# Patient Record
Sex: Male | Born: 1965 | Race: Black or African American | Hispanic: No | Smoking: Current every day smoker
Health system: Southern US, Community
[De-identification: ages and names within clinical notes are randomized; demographics above are authoritative.]

## PROBLEM LIST (undated history)

## (undated) DIAGNOSIS — F17200 Nicotine dependence, unspecified, uncomplicated: Secondary | ICD-10-CM

## (undated) DIAGNOSIS — F1020 Alcohol dependence, uncomplicated: Secondary | ICD-10-CM

## (undated) DIAGNOSIS — N400 Enlarged prostate without lower urinary tract symptoms: Secondary | ICD-10-CM

## (undated) DIAGNOSIS — I10 Essential (primary) hypertension: Secondary | ICD-10-CM

## (undated) DIAGNOSIS — I5032 Chronic diastolic (congestive) heart failure: Secondary | ICD-10-CM

## (undated) DIAGNOSIS — I82409 Acute embolism and thrombosis of unspecified deep veins of unspecified lower extremity: Secondary | ICD-10-CM

## (undated) HISTORY — PX: NO PAST SURGERIES: SHX2092

---

## 2004-07-05 ENCOUNTER — Emergency Department (HOSPITAL_COMMUNITY): Admission: EM | Admit: 2004-07-05 | Discharge: 2004-07-05 | Payer: Self-pay | Admitting: Emergency Medicine

## 2008-12-02 ENCOUNTER — Emergency Department (HOSPITAL_COMMUNITY): Admission: EM | Admit: 2008-12-02 | Discharge: 2008-12-02 | Payer: Self-pay | Admitting: Emergency Medicine

## 2009-06-27 ENCOUNTER — Emergency Department (HOSPITAL_COMMUNITY): Admission: EM | Admit: 2009-06-27 | Discharge: 2009-06-27 | Payer: Self-pay | Admitting: Family Medicine

## 2014-03-18 ENCOUNTER — Encounter: Payer: Self-pay | Admitting: Internal Medicine

## 2014-04-08 ENCOUNTER — Encounter: Payer: Self-pay | Admitting: Internal Medicine

## 2014-04-13 ENCOUNTER — Encounter: Payer: Self-pay | Admitting: Internal Medicine

## 2014-04-13 ENCOUNTER — Ambulatory Visit (INDEPENDENT_AMBULATORY_CARE_PROVIDER_SITE_OTHER): Payer: Self-pay | Admitting: Internal Medicine

## 2014-04-13 VITALS — BP 138/80 | HR 82 | Ht 71.0 in | Wt 176.0 lb

## 2014-04-13 DIAGNOSIS — R600 Localized edema: Secondary | ICD-10-CM

## 2014-04-13 DIAGNOSIS — R609 Edema, unspecified: Secondary | ICD-10-CM

## 2014-04-13 DIAGNOSIS — R634 Abnormal weight loss: Secondary | ICD-10-CM

## 2014-04-13 DIAGNOSIS — R6881 Early satiety: Secondary | ICD-10-CM

## 2014-04-13 DIAGNOSIS — F17200 Nicotine dependence, unspecified, uncomplicated: Secondary | ICD-10-CM

## 2014-04-13 MED ORDER — PEG-KCL-NACL-NASULF-NA ASC-C 100 G PO SOLR: 1.0000 | Freq: Once | ORAL | Status: DC

## 2014-04-13 NOTE — Patient Instructions (Signed)
You have been scheduled for an abdominal ultrasound [facility] of hospital) on 04/20/2014 at 8am Please arrive 15 minutes prior to your appointment for registration. Make certain not to have anything to eat or drink 6 hours prior to your appointment. Should you need to reschedule your appointment, please contact radiology at [phone]. This test typically takes about 30 minutes to perform.  Go to the basement for labs today  You have been scheduled for an endoscopy and colonoscopy. Please follow the written instructions given to you at your visit today. Please pick up your prep at the pharmacy within the next 1-3 days. If you use inhalers (even only as needed), please bring them with you on the day of your procedure. Your physician has requested that you go to [email or web address] and enter the access code given to you at your visit today. This web site gives a general overview about your procedure. However, you should still follow specific instructions given to you by our office regarding your preparation for the procedure.

## 2014-04-13 NOTE — Progress Notes (Signed)
Patient ID: Claire Shown, male   DOB: 1966/05/09, 48 y.o.   MRN: [id number] HPI: Mr Roldan is a 48 year old male with little past medical history who is seen to evaluate weight loss.  He is here alone today but states that his aunt insisted that he schedule an appointment with a doctor. He does not have primary care and last saw Dr. probably 5 years ago. He denies specific complaint but on questioning reports weight loss, though he has a difficult time quantifying, because he does not weigh himself at home. He endorses loss of appetite and early satiety. He reports he previously ate 3 meals a day but is now eating 2 meals a day. He denies nausea or vomiting. Denies heartburn. Denies dysphagia or odynophagia. Denies abdominal pain. Denies change in bowel movement including diarrhea or constipation. Denies black or tarry stools and no rectal bleeding. He does smoke cigarettes about 10 cigarettes a day. He averages one beer per day but admits to previous heavy alcohol use. Denies illicit drug use. No prior surgeries. He reports significant stress in his life specifically because he is raising 4 sons. He lost his wife 3 years ago. He has 2 adult sons who finished high school but are currently unemployed. He denies depression or anxiety today. He does endorse her last 2 weeks noticing lower extremity edema but denies increasing abdominal girth. He denies a history of jaundice. Denies family history of colorectal cancer, GI tract malignancy, IBD or liver disease   History reviewed. No pertinent past medical history.  Past Surgical History  Procedure Laterality Date  . No past surgeries      No outpatient prescriptions prior to visit.   No facility-administered medications prior to visit.    No Known Allergies  Family History  Problem Relation Age of Onset  . Colon cancer Neg Hx   . Stomach cancer Neg Hx     History  Substance Use Topics  . Smoking status: Current Every Day Smoker  . Smokeless  tobacco: Never Used  . Alcohol Use: Yes     Comment: one daily     ROS: As per history of present illness, otherwise negative  BP 138/80  Pulse 82  Ht 5\' 11"  (1.803 m)  Wt 176 lb (79.833 kg)  BMI 24.56 kg/m2 Constitutional: Well-developed and well-nourished. No distress. HEENT: Normocephalic and atraumatic. Oropharynx is clear and moist. No oropharyngeal exudate. Conjunctivae are normal.  No scleral icterus. Neck: Neck supple. Trachea midline. Cardiovascular: Normal rate, regular rhythm and intact distal pulses. No M/R/G Pulmonary/chest: Effort normal and breath sounds normal. No wheezing, rales or rhonchi. Abdominal: Soft, nontender, nondistended. Bowel sounds active throughout.  liver edge not palpable  Extremities: no clubbing, cyanosis, with 2+ lower extremity edema  Neurological: Alert and oriented to person place and time. Skin: Skin is warm and dry. No rashes noted. Psychiatric: Normal mood and affect. Behavior is normal.  ASSESSMENT/PLAN: 49 year old male with little past medical history who is seen to evaluate weight loss, also with complaints of early satiety and lower extremity edema  1.  Weight loss//early satiety -- he has had little medical care recently and I have no way to know how much weight he has truly lost. I am concerned because he admits to previous heavy alcohol use and I have ordered an abdominal ultrasound to evaluate his liver. I will check labs today to include CBC, CMP, INR, hepatitis C antibody and HIV. I recommended upper endoscopy to rule out upper GI inflammation and also  malignancy given his weight loss and early satiety.  I've also recommended colonoscopy for screening and given his weight loss. We discussed both tests including the risks and benefits and he is agreeable to proceed  2. Lower extremity edema -- await abdominal ultrasound which help evaluate the liver and also rule out splenomegaly and abdominal ascites. There is certainly not tense  ascites. I recommended primary care consult, but he declines and wants to finish with my workup first. I stated that I very much recommend and would be more comfortable for him to see primary care provider regarding his lower extremity edema.  Further recommendations after labs, imaging and endoscopies

## 2014-04-14 ENCOUNTER — Telehealth: Payer: Self-pay | Admitting: *Deleted

## 2014-04-14 NOTE — Telephone Encounter (Signed)
Dr Rhea BeltonPyrtle, No Working number for this patient  He was informed to have his labs drawn before he left

## 2014-04-14 NOTE — Telephone Encounter (Signed)
Message copied by Marlowe KaysSTALLINGS, Glennon Kopko M on Tue Apr 14, 2014  4:12 PM ------      Message from: Beverley FiedlerPYRTLE, JAY M      Created: Tue Apr 14, 2014  3:36 PM       Pt left without having labs done.      Please call him and ask him to return to the lab for tests as ordered.      Thanks      JMP       ------

## 2014-04-15 NOTE — Telephone Encounter (Signed)
DR PYRTLE AWARE NO WORKING NUMBERS FOR THIS PT. WILL GET LABS IF HE SHOWS FOR HIS PROCEDURES ON THAT DAY

## 2014-04-20 ENCOUNTER — Ambulatory Visit (HOSPITAL_COMMUNITY): Payer: Self-pay

## 2014-05-26 ENCOUNTER — Telehealth: Payer: Self-pay | Admitting: Internal Medicine

## 2014-05-26 NOTE — Telephone Encounter (Signed)
No charge, but pt is strongly encouraged to proceed with work-up, come for labs and establish with PCP

## 2014-05-27 ENCOUNTER — Encounter: Payer: Self-pay | Admitting: Internal Medicine

## 2018-06-12 ENCOUNTER — Other Ambulatory Visit: Payer: Self-pay | Admitting: Internal Medicine

## 2018-06-12 DIAGNOSIS — I82402 Acute embolism and thrombosis of unspecified deep veins of left lower extremity: Secondary | ICD-10-CM

## 2018-06-18 ENCOUNTER — Ambulatory Visit: Payer: No Typology Code available for payment source

## 2018-06-18 DIAGNOSIS — I82402 Acute embolism and thrombosis of unspecified deep veins of left lower extremity: Secondary | ICD-10-CM

## 2019-12-29 IMAGING — US US EXTREM LOW VENOUS*L*
1 series · 13 of 24 positions shown · non-contrast
Comparison: None.

CLINICAL DATA: 52-year-old male with left lower extremity pain and
swelling since DVT diagnosed in Saturday April, 2017



[Series 1: us extrem low venous*left* · 0.08mm/px · 13 of 35 slices shown]
[im 1/35]
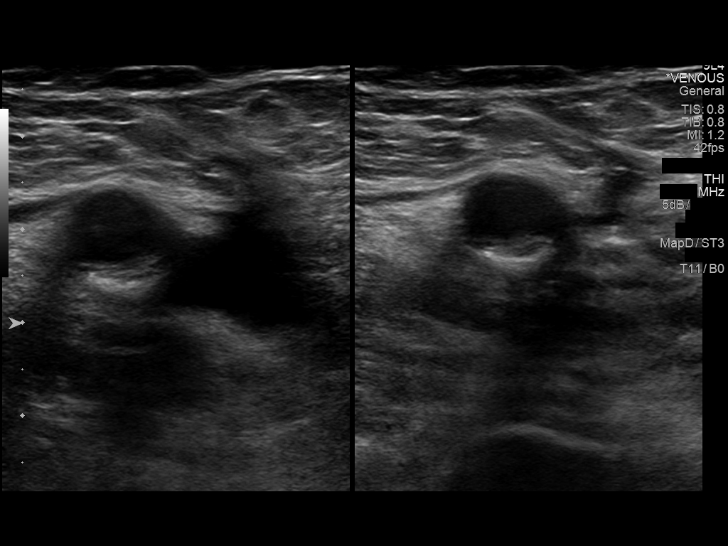
[im 3/35]
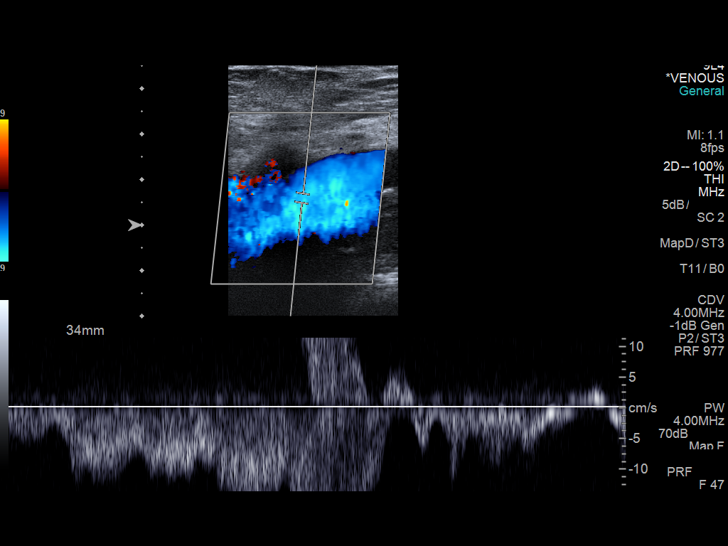
[im 6/35]
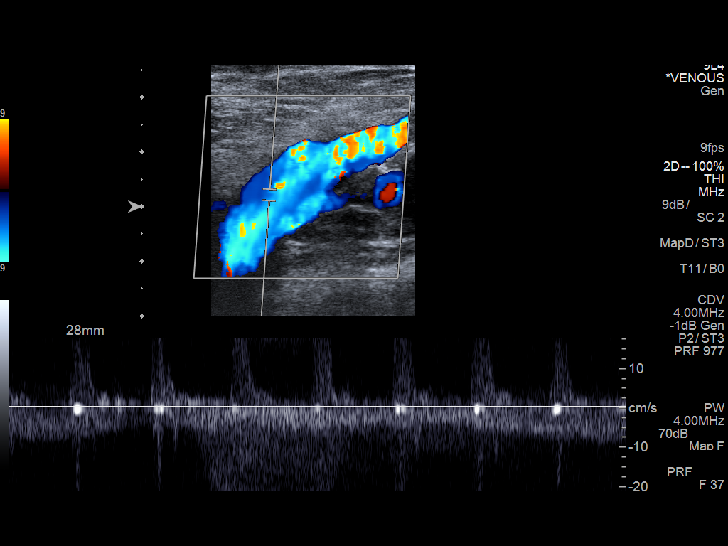
[im 9/35]
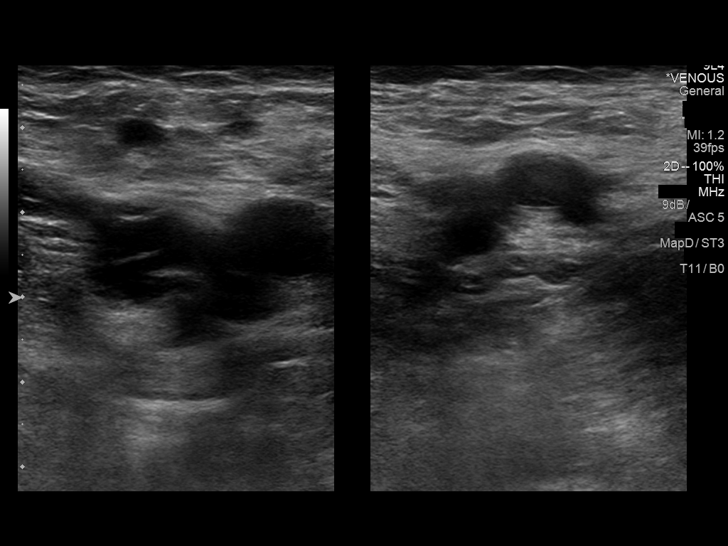
[im 12/35]
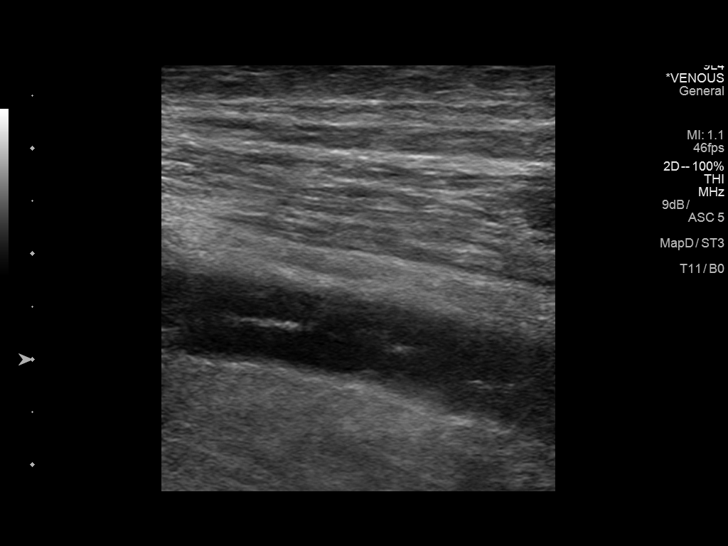
[im 15/35]
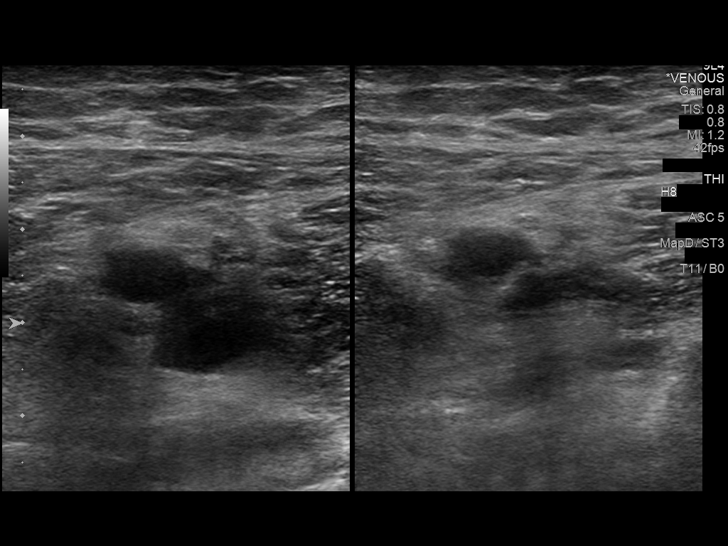
[im 18/35]
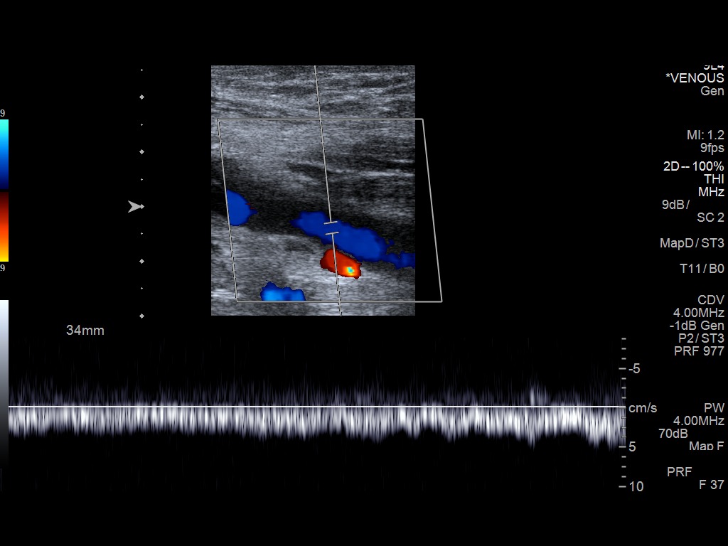
[im 20/35]
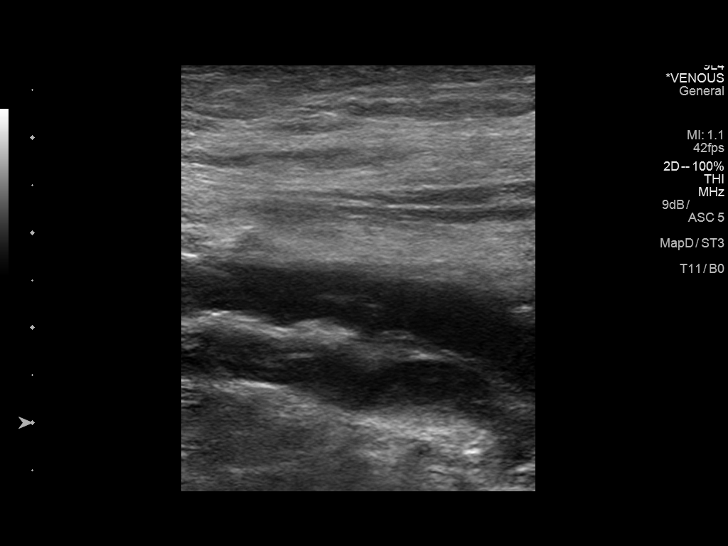
[im 23/35]
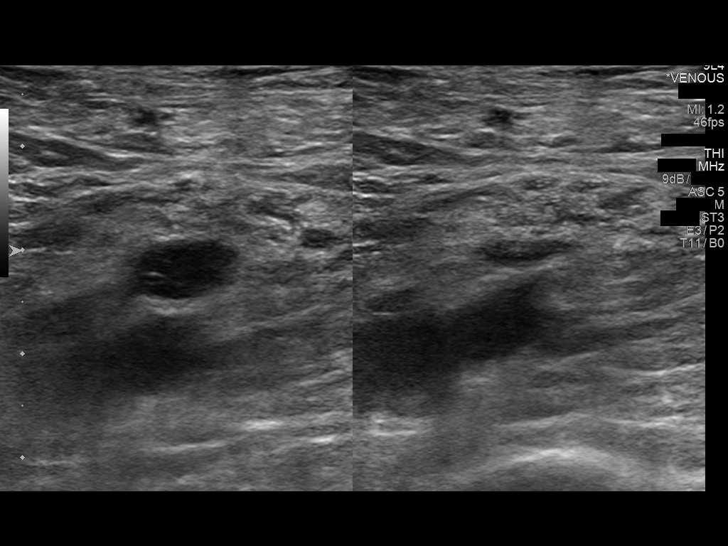
[im 26/35]
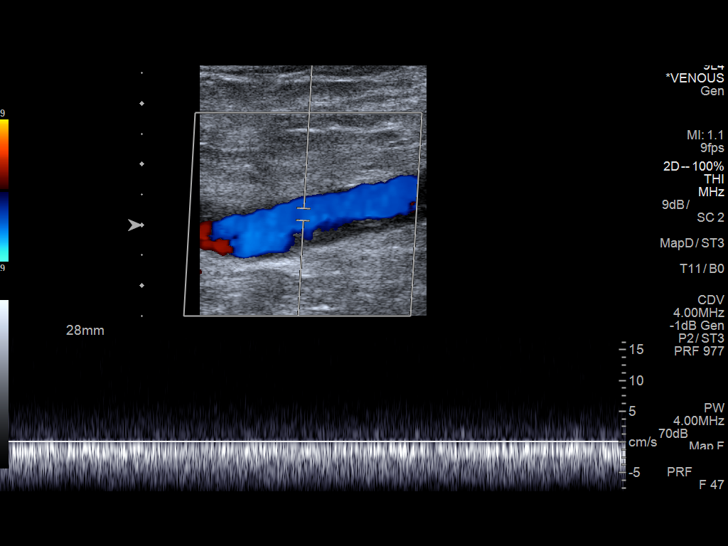
[im 29/35]
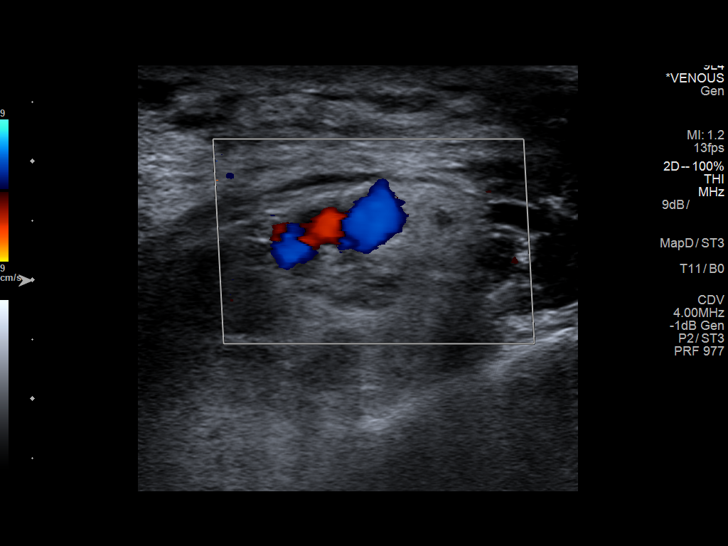
[im 32/35]
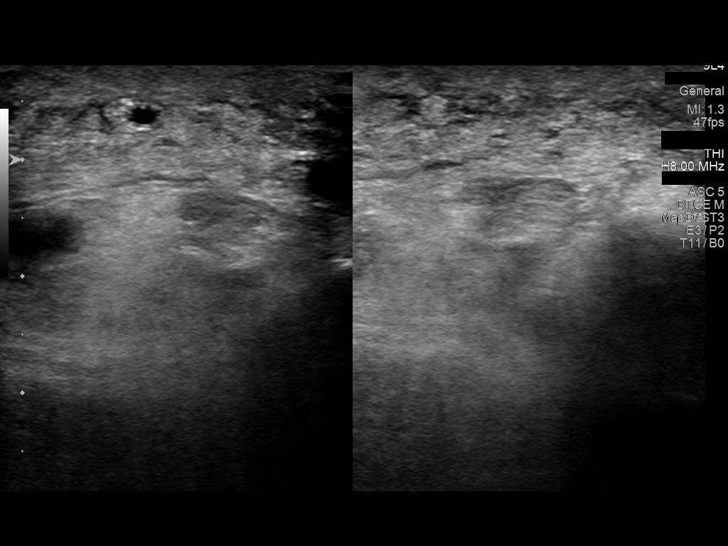
[im 35/35]
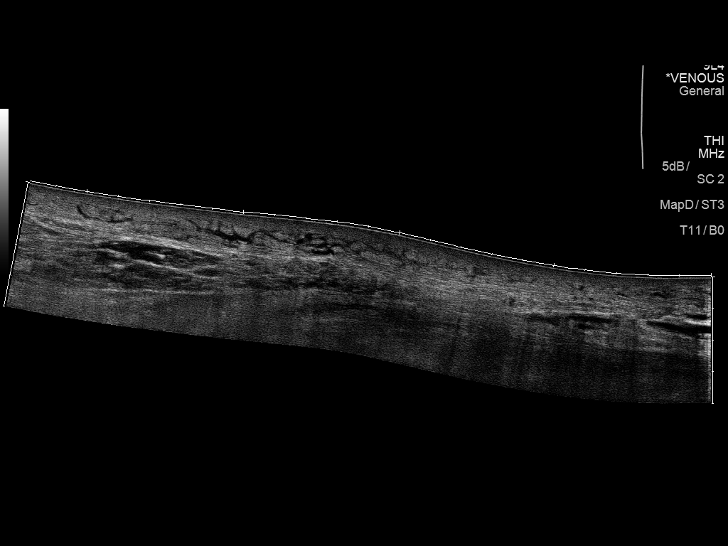

[13 of 24 positions shown; findings below may reference images not displayed]

FINDINGS: Contralateral Common Femoral Vein: Respiratory phasicity is normal
and symmetric with the symptomatic side. No evidence of thrombus.
Normal compressibility.

Common Femoral Vein: No evidence of thrombus. Normal
compressibility, respiratory phasicity and response to augmentation.

Saphenofemoral Junction: No evidence of thrombus. Normal
compressibility and flow on color Doppler imaging.

Profunda Femoral Vein: No evidence of thrombus. Normal
compressibility and flow on color Doppler imaging.

Femoral Vein: The femoral vein is abnormal beginning in the upper
thigh. Linear echogenic material is present within the vein which is
only partially compressible. However, there is relatively robust
flow on color Doppler imaging. The imaging appearance is most
consistent with residual but largely recanalized chronic DVT.

Popliteal Vein: Chronic DVT extends into the popliteal vein. There
is eccentric wall thickening. Color flow persists on color Doppler
imaging. The vein remains mostly but incompletely compressible. No
evidence of acute DVT.

Calf Veins: No evidence of thrombus. Normal compressibility and flow
on color Doppler imaging.

Superficial Great Saphenous Vein: No evidence of thrombus. Normal
compressibility.

Venous Reflux:  None.

Other Findings: Edema present within the superficial subcutaneous
fat of the left lower extremity from the mid calf to the ankle.
IMPRESSION: 1. No evidence of acute deep venous thrombosis.
2. Residual chronic but largely recanalized DVT in the femoral vein
throughout the thigh and popliteal vein at the knee.

## 2023-01-25 ENCOUNTER — Other Ambulatory Visit (HOSPITAL_COMMUNITY): Payer: No Typology Code available for payment source

## 2023-01-25 ENCOUNTER — Inpatient Hospital Stay (HOSPITAL_COMMUNITY)
Admission: EM | Admit: 2023-01-25 | Discharge: 2023-01-27 | DRG: 291 | Disposition: A | Discharge status: Home / Self Care | Payer: No Typology Code available for payment source | Attending: Internal Medicine | Admitting: Internal Medicine

## 2023-01-25 ENCOUNTER — Other Ambulatory Visit: Payer: Self-pay

## 2023-01-25 ENCOUNTER — Emergency Department (HOSPITAL_COMMUNITY): Payer: No Typology Code available for payment source

## 2023-01-25 ENCOUNTER — Encounter (HOSPITAL_COMMUNITY): Payer: Self-pay

## 2023-01-25 DIAGNOSIS — I5033 Acute on chronic diastolic (congestive) heart failure: Secondary | ICD-10-CM | POA: Diagnosis present

## 2023-01-25 DIAGNOSIS — E119 Type 2 diabetes mellitus without complications: Secondary | ICD-10-CM | POA: Diagnosis present

## 2023-01-25 DIAGNOSIS — I110 Hypertensive heart disease with heart failure: Secondary | ICD-10-CM | POA: Diagnosis present

## 2023-01-25 DIAGNOSIS — N400 Enlarged prostate without lower urinary tract symptoms: Secondary | ICD-10-CM | POA: Diagnosis present

## 2023-01-25 DIAGNOSIS — Z7982 Long term (current) use of aspirin: Secondary | ICD-10-CM

## 2023-01-25 DIAGNOSIS — Y92410 Unspecified street and highway as the place of occurrence of the external cause: Secondary | ICD-10-CM | POA: Diagnosis not present

## 2023-01-25 DIAGNOSIS — Z86718 Personal history of other venous thrombosis and embolism: Secondary | ICD-10-CM | POA: Diagnosis not present

## 2023-01-25 DIAGNOSIS — Z79899 Other long term (current) drug therapy: Secondary | ICD-10-CM

## 2023-01-25 DIAGNOSIS — A419 Sepsis, unspecified organism: Secondary | ICD-10-CM | POA: Diagnosis present

## 2023-01-25 DIAGNOSIS — E871 Hypo-osmolality and hyponatremia: Secondary | ICD-10-CM | POA: Diagnosis present

## 2023-01-25 DIAGNOSIS — Y904 Blood alcohol level of 80-99 mg/100 ml: Secondary | ICD-10-CM | POA: Diagnosis present

## 2023-01-25 DIAGNOSIS — I10 Essential (primary) hypertension: Secondary | ICD-10-CM | POA: Diagnosis not present

## 2023-01-25 DIAGNOSIS — F1020 Alcohol dependence, uncomplicated: Secondary | ICD-10-CM | POA: Diagnosis present

## 2023-01-25 DIAGNOSIS — R531 Weakness: Secondary | ICD-10-CM

## 2023-01-25 DIAGNOSIS — F17200 Nicotine dependence, unspecified, uncomplicated: Secondary | ICD-10-CM | POA: Diagnosis present

## 2023-01-25 DIAGNOSIS — Z8673 Personal history of transient ischemic attack (TIA), and cerebral infarction without residual deficits: Secondary | ICD-10-CM

## 2023-01-25 DIAGNOSIS — I82409 Acute embolism and thrombosis of unspecified deep veins of unspecified lower extremity: Secondary | ICD-10-CM | POA: Diagnosis present

## 2023-01-25 DIAGNOSIS — D649 Anemia, unspecified: Secondary | ICD-10-CM | POA: Diagnosis present

## 2023-01-25 DIAGNOSIS — R0602 Shortness of breath: Secondary | ICD-10-CM | POA: Diagnosis present

## 2023-01-25 DIAGNOSIS — J9811 Atelectasis: Secondary | ICD-10-CM | POA: Diagnosis present

## 2023-01-25 DIAGNOSIS — E872 Acidosis, unspecified: Secondary | ICD-10-CM | POA: Diagnosis present

## 2023-01-25 DIAGNOSIS — J9601 Acute respiratory failure with hypoxia: Secondary | ICD-10-CM | POA: Diagnosis present

## 2023-01-25 DIAGNOSIS — N281 Cyst of kidney, acquired: Secondary | ICD-10-CM | POA: Diagnosis present

## 2023-01-25 DIAGNOSIS — I5032 Chronic diastolic (congestive) heart failure: Secondary | ICD-10-CM | POA: Diagnosis present

## 2023-01-25 DIAGNOSIS — Z1152 Encounter for screening for COVID-19: Secondary | ICD-10-CM | POA: Diagnosis not present

## 2023-01-25 DIAGNOSIS — Z86711 Personal history of pulmonary embolism: Secondary | ICD-10-CM

## 2023-01-25 DIAGNOSIS — Z7901 Long term (current) use of anticoagulants: Secondary | ICD-10-CM

## 2023-01-25 DIAGNOSIS — I959 Hypotension, unspecified: Secondary | ICD-10-CM | POA: Diagnosis present

## 2023-01-25 DIAGNOSIS — I5031 Acute diastolic (congestive) heart failure: Secondary | ICD-10-CM | POA: Diagnosis not present

## 2023-01-25 DIAGNOSIS — R000 Tachycardia, unspecified: Secondary | ICD-10-CM

## 2023-01-25 HISTORY — DX: Alcohol dependence, uncomplicated: F10.20

## 2023-01-25 HISTORY — DX: Benign prostatic hyperplasia without lower urinary tract symptoms: N40.0

## 2023-01-25 HISTORY — DX: Essential (primary) hypertension: I10

## 2023-01-25 HISTORY — DX: Acute embolism and thrombosis of unspecified deep veins of unspecified lower extremity: I82.409

## 2023-01-25 HISTORY — DX: Chronic diastolic (congestive) heart failure: I50.32

## 2023-01-25 HISTORY — DX: Nicotine dependence, unspecified, uncomplicated: F17.200

## 2023-01-25 LAB — TROPONIN I (HIGH SENSITIVITY)
Troponin I (High Sensitivity): 261 ng/L (ref <18)
Troponin I (High Sensitivity): 229 ng/L (ref <18)

## 2023-01-25 LAB — COMPREHENSIVE METABOLIC PANEL
ALT: 15 U/L (ref 0–44)
AST: 29 U/L (ref 15–41)
Albumin: 2.6 g/dL — ABNORMAL LOW (ref 3.5–5.0)
Alkaline Phosphatase: 56 U/L (ref 38–126)
Anion gap: 15 (ref 5–15)
BUN: 10 mg/dL (ref 6–20)
CO2: 20 mmol/L — ABNORMAL LOW (ref 22–32)
Calcium: 8.6 mg/dL — ABNORMAL LOW (ref 8.9–10.3)
Chloride: 85 mmol/L — ABNORMAL LOW (ref 98–111)
Creatinine, Ser: 0.89 mg/dL (ref 0.61–1.24)
GFR, Estimated: >60 mL/min (ref >60)
Glucose, Bld: 95 mg/dL (ref 70–99)
Potassium: 3.7 mmol/L (ref 3.5–5.1)
Sodium: 120 mmol/L — ABNORMAL LOW (ref 135–145)
Total Bilirubin: 0.7 mg/dL (ref 0.3–1.2)
Total Protein: 7.9 g/dL (ref 6.5–8.1)

## 2023-01-25 LAB — URINALYSIS, W/ REFLEX TO CULTURE (INFECTION SUSPECTED)
Bacteria, UA: NONE SEEN
Bilirubin Urine: NEGATIVE
Glucose, UA: NEGATIVE mg/dL
Ketones, ur: NEGATIVE mg/dL
Leukocytes,Ua: NEGATIVE
Nitrite: NEGATIVE
Protein, ur: NEGATIVE mg/dL
Specific Gravity, Urine: 1.016 (ref 1.005–1.030)
pH: 5 (ref 5.0–8.0)

## 2023-01-25 LAB — CBC WITH DIFFERENTIAL/PLATELET
Abs Immature Granulocytes: 0.17 10*3/uL — ABNORMAL HIGH (ref 0.00–0.07)
Basophils Absolute: 0.1 10*3/uL (ref 0.0–0.1)
Basophils Relative: 0 %
Eosinophils Absolute: 0 10*3/uL (ref 0.0–0.5)
Eosinophils Relative: 0 %
HCT: 36.5 % — ABNORMAL LOW (ref 39.0–52.0)
Hemoglobin: 12.2 g/dL — ABNORMAL LOW (ref 13.0–17.0)
Immature Granulocytes: 1 %
Lymphocytes Relative: 3 %
Lymphs Abs: 0.5 10*3/uL — ABNORMAL LOW (ref 0.7–4.0)
MCH: 29.8 pg (ref 26.0–34.0)
MCHC: 33.4 g/dL (ref 30.0–36.0)
MCV: 89.2 fL (ref 80.0–100.0)
Monocytes Absolute: 0.3 10*3/uL (ref 0.1–1.0)
Monocytes Relative: 2 %
Neutro Abs: 17.3 10*3/uL — ABNORMAL HIGH (ref 1.7–7.7)
Neutrophils Relative %: 94 %
Platelets: 374 10*3/uL (ref 150–400)
RBC: 4.09 MIL/uL — ABNORMAL LOW (ref 4.22–5.81)
RDW: 19.1 % — ABNORMAL HIGH (ref 11.5–15.5)
WBC: 18.3 10*3/uL — ABNORMAL HIGH (ref 4.0–10.5)
nRBC: 0 % (ref 0.0–0.2)

## 2023-01-25 LAB — I-STAT CHEM 8, ED
BUN: 13 mg/dL (ref 6–20)
Calcium, Ion: 0.93 mmol/L — ABNORMAL LOW (ref 1.15–1.40)
Chloride: 87 mmol/L — ABNORMAL LOW (ref 98–111)
Creatinine, Ser: 0.9 mg/dL (ref 0.61–1.24)
Glucose, Bld: 90 mg/dL (ref 70–99)
HCT: 42 % (ref 39.0–52.0)
Hemoglobin: 14.3 g/dL (ref 13.0–17.0)
Potassium: 6 mmol/L — ABNORMAL HIGH (ref 3.5–5.1)
Sodium: 117 mmol/L — CL (ref 135–145)
TCO2: 24 mmol/L (ref 22–32)

## 2023-01-25 LAB — SARS CORONAVIRUS 2 BY RT PCR: SARS Coronavirus 2 by RT PCR: NEGATIVE

## 2023-01-25 LAB — PROTIME-INR
INR: 2.4 — ABNORMAL HIGH (ref 0.8–1.2)
Prothrombin Time: 25.7 seconds — ABNORMAL HIGH (ref 11.4–15.2)

## 2023-01-25 LAB — APTT: aPTT: 46 seconds — ABNORMAL HIGH (ref 24–36)

## 2023-01-25 LAB — PROCALCITONIN: Procalcitonin: 3.75 ng/mL

## 2023-01-25 LAB — LACTIC ACID, PLASMA
Lactic Acid, Venous: 2.7 mmol/L (ref 0.5–1.9)
Lactic Acid, Venous: 1.7 mmol/L (ref 0.5–1.9)

## 2023-01-25 LAB — BRAIN NATRIURETIC PEPTIDE: B Natriuretic Peptide: 2525.2 pg/mL — ABNORMAL HIGH (ref 0.0–100.0)

## 2023-01-25 LAB — ETHANOL: Alcohol, Ethyl (B): 71 mg/dL — ABNORMAL HIGH (ref <10)

## 2023-01-25 LAB — HIV ANTIBODY (ROUTINE TESTING W REFLEX): HIV Screen 4th Generation wRfx: NONREACTIVE

## 2023-01-25 LAB — I-STAT CREATININE, ED: Creatinine, Ser: 0.9 mg/dL (ref 0.61–1.24)

## 2023-01-25 LAB — SAMPLE TO BLOOD BANK

## 2023-01-25 LAB — CDS SEROLOGY

## 2023-01-25 MED ORDER — ASPIRIN 81 MG PO TBEC
81.0000 mg | DELAYED_RELEASE_TABLET | Freq: Every day | ORAL | Status: DC
  Administered 2023-01-26 – 2023-01-27 (×2): 81 mg via ORAL
  Filled 2023-01-25 (×2): qty 1

## 2023-01-25 MED ORDER — ACETAMINOPHEN 650 MG RE SUPP: 650.0000 mg | Freq: Four times a day (QID) | RECTAL | Status: DC | PRN

## 2023-01-25 MED ORDER — VANCOMYCIN HCL 1750 MG/350ML IV SOLN
1750.0000 mg | Freq: Once | INTRAVENOUS | Status: AC
  Administered 2023-01-25: 1750 mg via INTRAVENOUS
  Filled 2023-01-25: qty 350

## 2023-01-25 MED ORDER — SODIUM CHLORIDE 0.9 % IV SOLN: 500.0000 mg | INTRAVENOUS | Status: DC

## 2023-01-25 MED ORDER — DOCUSATE SODIUM 100 MG PO CAPS
100.0000 mg | ORAL_CAPSULE | Freq: Two times a day (BID) | ORAL | Status: DC
  Administered 2023-01-25: 100 mg via ORAL
  Filled 2023-01-25 (×3): qty 1

## 2023-01-25 MED ORDER — METRONIDAZOLE 500 MG/100ML IV SOLN
500.0000 mg | Freq: Once | INTRAVENOUS | Status: AC
  Administered 2023-01-25: 500 mg via INTRAVENOUS
  Filled 2023-01-25: qty 100

## 2023-01-25 MED ORDER — OXYCODONE HCL 5 MG PO TABS
5.0000 mg | ORAL_TABLET | ORAL | Status: DC | PRN
  Administered 2023-01-26 (×2): 5 mg via ORAL
  Filled 2023-01-25 (×2): qty 1

## 2023-01-25 MED ORDER — SODIUM CHLORIDE 0.9 % IV BOLUS
1000.0000 mL | Freq: Once | INTRAVENOUS | Status: AC
  Administered 2023-01-25: 1000 mL via INTRAVENOUS

## 2023-01-25 MED ORDER — LORAZEPAM 2 MG/ML IJ SOLN: 1.0000 mg | INTRAMUSCULAR | Status: DC | PRN

## 2023-01-25 MED ORDER — ONDANSETRON HCL 4 MG/2ML IJ SOLN: 4.0000 mg | Freq: Four times a day (QID) | INTRAMUSCULAR | Status: DC | PRN

## 2023-01-25 MED ORDER — NITROGLYCERIN 2 % TD OINT
1.0000 [in_us] | TOPICAL_OINTMENT | Freq: Once | TRANSDERMAL | Status: AC
  Administered 2023-01-25: 1 [in_us] via TOPICAL
  Filled 2023-01-25: qty 1

## 2023-01-25 MED ORDER — FUROSEMIDE 10 MG/ML IJ SOLN
40.0000 mg | Freq: Two times a day (BID) | INTRAMUSCULAR | Status: DC
  Administered 2023-01-25 – 2023-01-26 (×2): 40 mg via INTRAVENOUS
  Filled 2023-01-25 (×2): qty 4

## 2023-01-25 MED ORDER — FOLIC ACID 1 MG PO TABS
1.0000 mg | ORAL_TABLET | Freq: Every day | ORAL | Status: DC
  Administered 2023-01-26 – 2023-01-27 (×2): 1 mg via ORAL
  Filled 2023-01-25 (×2): qty 1

## 2023-01-25 MED ORDER — LISINOPRIL 5 MG PO TABS
5.0000 mg | ORAL_TABLET | Freq: Every day | ORAL | Status: DC
  Administered 2023-01-26: 5 mg via ORAL
  Filled 2023-01-25: qty 1

## 2023-01-25 MED ORDER — IOHEXOL 350 MG/ML SOLN
75.0000 mL | Freq: Once | INTRAVENOUS | Status: AC | PRN
  Administered 2023-01-25: 75 mL via INTRAVENOUS

## 2023-01-25 MED ORDER — POLYETHYLENE GLYCOL 3350 17 G PO PACK: 17.0000 g | PACK | Freq: Every day | ORAL | Status: DC | PRN

## 2023-01-25 MED ORDER — SODIUM CHLORIDE 0.9 % IV SOLN: 2.0000 g | INTRAVENOUS | Status: DC

## 2023-01-25 MED ORDER — RIVAROXABAN 20 MG PO TABS
20.0000 mg | ORAL_TABLET | Freq: Every day | ORAL | Status: DC
  Administered 2023-01-25 – 2023-01-26 (×2): 20 mg via ORAL
  Filled 2023-01-25 (×2): qty 1

## 2023-01-25 MED ORDER — LORAZEPAM 1 MG PO TABS: 1.0000 mg | ORAL_TABLET | ORAL | Status: DC | PRN

## 2023-01-25 MED ORDER — ONDANSETRON HCL 4 MG PO TABS: 4.0000 mg | ORAL_TABLET | Freq: Four times a day (QID) | ORAL | Status: DC | PRN

## 2023-01-25 MED ORDER — HYDRALAZINE HCL 20 MG/ML IJ SOLN: 5.0000 mg | INTRAMUSCULAR | Status: DC | PRN

## 2023-01-25 MED ORDER — THIAMINE HCL 100 MG/ML IJ SOLN: 100.0000 mg | Freq: Every day | INTRAMUSCULAR | Status: DC

## 2023-01-25 MED ORDER — DIPHENHYDRAMINE HCL 50 MG/ML IJ SOLN
25.0000 mg | Freq: Once | INTRAMUSCULAR | Status: AC
  Administered 2023-01-25: 25 mg via INTRAVENOUS
  Filled 2023-01-25: qty 1

## 2023-01-25 MED ORDER — ADULT MULTIVITAMIN W/MINERALS CH
1.0000 | ORAL_TABLET | Freq: Every day | ORAL | Status: DC
  Administered 2023-01-26 – 2023-01-27 (×2): 1 via ORAL
  Filled 2023-01-25 (×2): qty 1

## 2023-01-25 MED ORDER — ACETAMINOPHEN 325 MG PO TABS
650.0000 mg | ORAL_TABLET | Freq: Four times a day (QID) | ORAL | Status: DC | PRN
  Administered 2023-01-25: 650 mg via ORAL
  Filled 2023-01-25: qty 2

## 2023-01-25 MED ORDER — THIAMINE MONONITRATE 100 MG PO TABS
100.0000 mg | ORAL_TABLET | Freq: Every day | ORAL | Status: DC
  Administered 2023-01-26 – 2023-01-27 (×2): 100 mg via ORAL
  Filled 2023-01-25 (×2): qty 1

## 2023-01-25 MED ORDER — NICOTINE 14 MG/24HR TD PT24
14.0000 mg | MEDICATED_PATCH | Freq: Every day | TRANSDERMAL | Status: DC
  Filled 2023-01-25 (×2): qty 1

## 2023-01-25 MED ORDER — NIFEDIPINE ER OSMOTIC RELEASE 60 MG PO TB24: 90.0000 mg | ORAL_TABLET | Freq: Every day | ORAL | Status: DC

## 2023-01-25 MED ORDER — LACTATED RINGERS IV SOLN: INTRAVENOUS | Status: DC

## 2023-01-25 MED ORDER — SODIUM CHLORIDE 0.9% FLUSH
3.0000 mL | Freq: Two times a day (BID) | INTRAVENOUS | Status: DC
  Administered 2023-01-25 – 2023-01-26 (×3): 3 mL via INTRAVENOUS

## 2023-01-25 MED ORDER — VANCOMYCIN HCL IN DEXTROSE 1-5 GM/200ML-% IV SOLN: 1000.0000 mg | Freq: Once | INTRAVENOUS | Status: DC

## 2023-01-25 MED ORDER — MORPHINE SULFATE (PF) 2 MG/ML IV SOLN
2.0000 mg | INTRAVENOUS | Status: DC | PRN
  Administered 2023-01-26: 2 mg via INTRAVENOUS
  Filled 2023-01-25: qty 1

## 2023-01-25 MED ORDER — SODIUM CHLORIDE 0.9 % IV SOLN
2.0000 g | Freq: Once | INTRAVENOUS | Status: AC
  Administered 2023-01-25: 2 g via INTRAVENOUS
  Filled 2023-01-25: qty 12.5

## 2023-01-25 MED ORDER — LACTATED RINGERS IV BOLUS (SEPSIS)
2000.0000 mL | Freq: Once | INTRAVENOUS | Status: AC
  Administered 2023-01-25: 2000 mL via INTRAVENOUS

## 2023-01-25 MED ORDER — ACETAMINOPHEN 325 MG PO TABS: 650.0000 mg | ORAL_TABLET | Freq: Once | ORAL | Status: DC

## 2023-01-25 MED ORDER — CARVEDILOL 6.25 MG PO TABS
6.2500 mg | ORAL_TABLET | Freq: Two times a day (BID) | ORAL | Status: DC
  Administered 2023-01-25 – 2023-01-27 (×4): 6.25 mg via ORAL
  Filled 2023-01-25 (×4): qty 1

## 2023-01-25 MED ORDER — FUROSEMIDE 10 MG/ML IJ SOLN
40.0000 mg | Freq: Once | INTRAMUSCULAR | Status: AC
  Administered 2023-01-25: 40 mg via INTRAVENOUS
  Filled 2023-01-25: qty 4

## 2023-01-25 MED ORDER — TAMSULOSIN HCL 0.4 MG PO CAPS
0.4000 mg | ORAL_CAPSULE | Freq: Every day | ORAL | Status: DC
  Administered 2023-01-26 – 2023-01-27 (×2): 0.4 mg via ORAL
  Filled 2023-01-25 (×2): qty 1

## 2023-01-25 MED ORDER — BISACODYL 5 MG PO TBEC: 5.0000 mg | DELAYED_RELEASE_TABLET | Freq: Every day | ORAL | Status: DC | PRN
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
## 2023-01-25 NOTE — ED Notes (Signed)
Patient transported to CT with TRN on a monitor  

## 2023-01-25 NOTE — ED Notes (Signed)
EDP  notified of Critical lab values.   Lactic Acid - 2.7
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
## 2023-01-25 NOTE — ED Provider Notes (Signed)
Care transferred to me.  According to Dr. Preston Fleeting patient's work of breathing is better.  He is hyponatremic though unclear how long.  He has already seen multiple boluses of fluids and was also given a dose of Lasix as it appeared he might have some acute pulmonary edema.  He does endorse a cough though sounds more chronic. Will need progressive care. Discussed with Dr. Ophelia Charter for admission.   Pricilla Loveless, MD 01/25/23 [phone]

## 2023-01-25 NOTE — Sepsis Progress Note (Signed)
eLink is following this Code Sepsis. °
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
## 2023-01-25 NOTE — ED Notes (Signed)
RT called to come and look at bipap settings pt states he doesn't feel like it is doing anything
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
## 2023-01-26 ENCOUNTER — Inpatient Hospital Stay (HOSPITAL_COMMUNITY): Payer: No Typology Code available for payment source

## 2023-01-26 ENCOUNTER — Other Ambulatory Visit (HOSPITAL_COMMUNITY): Payer: No Typology Code available for payment source

## 2023-01-26 DIAGNOSIS — I10 Essential (primary) hypertension: Secondary | ICD-10-CM

## 2023-01-26 DIAGNOSIS — I5031 Acute diastolic (congestive) heart failure: Secondary | ICD-10-CM

## 2023-01-26 LAB — CBC
HCT: 33.5 % — ABNORMAL LOW (ref 39.0–52.0)
Hemoglobin: 11.5 g/dL — ABNORMAL LOW (ref 13.0–17.0)
MCH: 30.4 pg (ref 26.0–34.0)
MCHC: 34.3 g/dL (ref 30.0–36.0)
MCV: 88.6 fL (ref 80.0–100.0)
Platelets: 323 10*3/uL (ref 150–400)
RBC: 3.78 MIL/uL — ABNORMAL LOW (ref 4.22–5.81)
RDW: 19.6 % — ABNORMAL HIGH (ref 11.5–15.5)
WBC: 10.6 10*3/uL — ABNORMAL HIGH (ref 4.0–10.5)
nRBC: 0 % (ref 0.0–0.2)

## 2023-01-26 LAB — BASIC METABOLIC PANEL
Anion gap: 13 (ref 5–15)
Anion gap: 10 (ref 5–15)
BUN: 19 mg/dL (ref 6–20)
BUN: 24 mg/dL — ABNORMAL HIGH (ref 6–20)
CO2: 24 mmol/L (ref 22–32)
CO2: 25 mmol/L (ref 22–32)
Calcium: 8.2 mg/dL — ABNORMAL LOW (ref 8.9–10.3)
Calcium: 8.5 mg/dL — ABNORMAL LOW (ref 8.9–10.3)
Chloride: 88 mmol/L — ABNORMAL LOW (ref 98–111)
Chloride: 91 mmol/L — ABNORMAL LOW (ref 98–111)
Creatinine, Ser: 1.04 mg/dL (ref 0.61–1.24)
Creatinine, Ser: 1.3 mg/dL — ABNORMAL HIGH (ref 0.61–1.24)
GFR, Estimated: >60 mL/min (ref >60)
GFR, Estimated: >60 mL/min (ref >60)
Glucose, Bld: 83 mg/dL (ref 70–99)
Glucose, Bld: 163 mg/dL — ABNORMAL HIGH (ref 70–99)
Potassium: 3.5 mmol/L (ref 3.5–5.1)
Potassium: 3.5 mmol/L (ref 3.5–5.1)
Sodium: 125 mmol/L — ABNORMAL LOW (ref 135–145)
Sodium: 126 mmol/L — ABNORMAL LOW (ref 135–145)

## 2023-01-26 LAB — ECHOCARDIOGRAM COMPLETE
AR max vel: 3.33 cm2
AV Peak grad: 10.2 mmHg
Ao pk vel: 1.6 m/s
Area-P 1/2: 3.17 cm2
Calc EF: 49.5 %
Height: 71 in
P 1/2 time: 479 msec
S' Lateral: 4.3 cm
Single Plane A2C EF: 48.7 %
Single Plane A4C EF: 50 %
Weight: 2885.38 oz

## 2023-01-26 LAB — CULTURE, BLOOD (ROUTINE X 2)
Culture: NO GROWTH
Culture: NO GROWTH

## 2023-01-26 MED ORDER — GLUCERNA SHAKE PO LIQD
237.0000 mL | Freq: Three times a day (TID) | ORAL | Status: DC
  Administered 2023-01-26 – 2023-01-27 (×3): 237 mL via ORAL

## 2023-01-26 MED ORDER — MORPHINE SULFATE (PF) 2 MG/ML IV SOLN
1.0000 mg | INTRAVENOUS | Status: DC | PRN
  Filled 2023-01-26: qty 1

## 2023-01-26 NOTE — Progress Notes (Signed)
Bipap order prn and wore in ED. Patient is on RA and bipap is not indicated at this time.

## 2023-01-26 NOTE — Progress Notes (Signed)
Heart Failure Navigator Progress Note  Assessed for Heart & Vascular TOC clinic readiness.  Patient no TOC per Dr. Jomarie Longs.   Navigator will sign off at this time.   Rhae Hammock, BSN, Scientist, clinical (histocompatibility and immunogenetics) Only
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
## 2023-01-26 NOTE — Progress Notes (Signed)
Echocardiogram 2D Echocardiogram has been performed.  Riley Ward 01/26/2023, 5:14 PM
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
## 2023-01-27 LAB — BASIC METABOLIC PANEL
Anion gap: 8 (ref 5–15)
BUN: 29 mg/dL — ABNORMAL HIGH (ref 6–20)
CO2: 24 mmol/L (ref 22–32)
Calcium: 8.1 mg/dL — ABNORMAL LOW (ref 8.9–10.3)
Chloride: 95 mmol/L — ABNORMAL LOW (ref 98–111)
Creatinine, Ser: 1.1 mg/dL (ref 0.61–1.24)
GFR, Estimated: >60 mL/min (ref >60)
Glucose, Bld: 121 mg/dL — ABNORMAL HIGH (ref 70–99)
Potassium: 3.4 mmol/L — ABNORMAL LOW (ref 3.5–5.1)
Sodium: 127 mmol/L — ABNORMAL LOW (ref 135–145)

## 2023-01-27 LAB — CULTURE, BLOOD (ROUTINE X 2): Special Requests: ADEQUATE

## 2023-01-27 LAB — CBC
HCT: 33.3 % — ABNORMAL LOW (ref 39.0–52.0)
Hemoglobin: 10.9 g/dL — ABNORMAL LOW (ref 13.0–17.0)
MCH: 29.8 pg (ref 26.0–34.0)
MCHC: 32.7 g/dL (ref 30.0–36.0)
MCV: 91 fL (ref 80.0–100.0)
Platelets: 327 10*3/uL (ref 150–400)
RBC: 3.66 MIL/uL — ABNORMAL LOW (ref 4.22–5.81)
RDW: 19 % — ABNORMAL HIGH (ref 11.5–15.5)
WBC: 9.3 10*3/uL (ref 4.0–10.5)
nRBC: 0 % (ref 0.0–0.2)

## 2023-01-27 MED ORDER — VITAMIN B-1 100 MG PO TABS: 100.0000 mg | ORAL_TABLET | Freq: Every day | ORAL | 0 refills | Status: DC

## 2023-01-27 MED ORDER — CARVEDILOL 6.25 MG PO TABS: 6.2500 mg | ORAL_TABLET | Freq: Two times a day (BID) | ORAL | 0 refills | Status: AC

## 2023-01-27 NOTE — Plan of Care (Signed)
°  Problem: Education: °Goal: Ability to demonstrate management of disease process will improve °Outcome: Adequate for Discharge °Goal: Ability to verbalize understanding of medication therapies will improve °Outcome: Adequate for Discharge °Goal: Individualized Educational Video(s) °Outcome: Adequate for Discharge °  °

## 2023-01-27 NOTE — Progress Notes (Signed)
Discharge inst. given Pt verbalize understanding. Continue with plan of care.

## 2023-01-27 NOTE — Progress Notes (Signed)
Mobility Specialist: Progress Note   01/27/23 0909  Mobility  Activity Ambulated with assistance in hallway  Level of Assistance Contact guard assist, steadying assist  Assistive Device None  Distance Ambulated (ft) 400 ft  Activity Response Tolerated well  Mobility Referral Yes  $Mobility charge 1 Mobility   Pre-Mobility: 77 HR Post-Mobility: 80 HR  Pt received in the bed and agreeable to mobility. Mod I with bed mobility and contact guard during ambulation. C/o RLE pain throughout session, no rating given. No c/o dizziness or SOB. Pt to the chair after session with call bell and phone in reach.   Riley Ward Mobility Specialist Please contact via SecureChat or Rehab office at [phone]

## 2023-01-27 NOTE — TOC Transition Note (Signed)
Transition of Care [facility]) - CM/SW Discharge Note   Patient Details  Name: Riley Ward MRN: [id number] Date of Birth: [birth year]  Transition of Care [facility]) CM/SW Contact:  Tom-Johnson, Hershal Coria, RN Phone Number: 01/27/2023, 9:55 AM   Clinical Narrative:     Patient is scheduled for discharge today.  Readmission Prevention Assessment done.  Outpatient referral, hospital f/u and discharge instructions on AVS. Single point cane ordered from [employer] and [facility] to deliver to patient at bedside.    Family to transport at discharge.  No further TOC needs noted.          Final next level of care: OP Rehab Barriers to Discharge: Barriers Resolved   Patient Goals and CMS Choice CMS Medicare.gov Compare Post Acute Care list provided to:: Patient Choice offered to / list presented to : Patient  Discharge Placement                  Patient to be transferred to facility by: Family      Discharge Plan and Services Additional resources added to the After Visit Summary for                  DME Arranged: Gilmer Mor DME Agency: [employer] Date DME Agency Contacted: 01/27/23 Time DME Agency Contacted: [phone] Representative spoke with at DME Agency: [facility] HH Arranged: NA HH Agency: NA        Social Determinants of Health (SDOH) Interventions SDOH Screenings   Food Insecurity: No Food Insecurity (01/25/2023)  Housing: Low Risk  (01/25/2023)  Transportation Needs: No Transportation Needs (01/25/2023)  Utilities: Not At Risk (01/25/2023)  Tobacco Use: High Risk (01/25/2023)     Readmission Risk Interventions    01/27/2023    9:51 AM  Readmission Risk Prevention Plan  Transportation Screening Complete  PCP or Specialist Appt within 5-7 Days Complete  Home Care Screening Complete  Medication Review (RN CM) Referral to Pharmacy

## 2023-01-29 LAB — CULTURE, BLOOD (ROUTINE X 2)
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
## 2023-01-30 LAB — CULTURE, BLOOD (ROUTINE X 2)

## 2024-03-08 ENCOUNTER — Emergency Department (HOSPITAL_COMMUNITY)

## 2024-03-08 ENCOUNTER — Encounter (HOSPITAL_COMMUNITY): Payer: Self-pay | Admitting: Emergency Medicine

## 2024-03-08 ENCOUNTER — Observation Stay (HOSPITAL_COMMUNITY)
Admission: EM | Admit: 2024-03-08 | Discharge: 2024-03-09 | Disposition: A | Discharge status: Home / Self Care | Attending: Emergency Medicine | Admitting: Emergency Medicine

## 2024-03-08 ENCOUNTER — Other Ambulatory Visit: Payer: Self-pay

## 2024-03-08 DIAGNOSIS — F10920 Alcohol use, unspecified with intoxication, uncomplicated: Secondary | ICD-10-CM

## 2024-03-08 DIAGNOSIS — W19XXXA Unspecified fall, initial encounter: Principal | ICD-10-CM

## 2024-03-08 DIAGNOSIS — I10 Essential (primary) hypertension: Secondary | ICD-10-CM | POA: Diagnosis present

## 2024-03-08 DIAGNOSIS — R040 Epistaxis: Secondary | ICD-10-CM | POA: Insufficient documentation

## 2024-03-08 DIAGNOSIS — W109XXA Fall (on) (from) unspecified stairs and steps, initial encounter: Secondary | ICD-10-CM | POA: Diagnosis not present

## 2024-03-08 DIAGNOSIS — F17210 Nicotine dependence, cigarettes, uncomplicated: Secondary | ICD-10-CM | POA: Insufficient documentation

## 2024-03-08 DIAGNOSIS — Z79899 Other long term (current) drug therapy: Secondary | ICD-10-CM | POA: Diagnosis not present

## 2024-03-08 DIAGNOSIS — I5032 Chronic diastolic (congestive) heart failure: Secondary | ICD-10-CM | POA: Diagnosis present

## 2024-03-08 DIAGNOSIS — N400 Enlarged prostate without lower urinary tract symptoms: Secondary | ICD-10-CM | POA: Diagnosis present

## 2024-03-08 DIAGNOSIS — E119 Type 2 diabetes mellitus without complications: Secondary | ICD-10-CM | POA: Diagnosis not present

## 2024-03-08 DIAGNOSIS — I77810 Thoracic aortic ectasia: Secondary | ICD-10-CM

## 2024-03-08 DIAGNOSIS — Z86718 Personal history of other venous thrombosis and embolism: Secondary | ICD-10-CM | POA: Diagnosis not present

## 2024-03-08 DIAGNOSIS — I77811 Abdominal aortic ectasia: Secondary | ICD-10-CM | POA: Diagnosis not present

## 2024-03-08 DIAGNOSIS — I110 Hypertensive heart disease with heart failure: Secondary | ICD-10-CM | POA: Diagnosis not present

## 2024-03-08 DIAGNOSIS — F10129 Alcohol abuse with intoxication, unspecified: Secondary | ICD-10-CM | POA: Diagnosis not present

## 2024-03-08 DIAGNOSIS — F10929 Alcohol use, unspecified with intoxication, unspecified: Secondary | ICD-10-CM

## 2024-03-08 DIAGNOSIS — S0003XA Contusion of scalp, initial encounter: Secondary | ICD-10-CM | POA: Insufficient documentation

## 2024-03-08 DIAGNOSIS — G9341 Metabolic encephalopathy: Secondary | ICD-10-CM

## 2024-03-08 DIAGNOSIS — Z23 Encounter for immunization: Secondary | ICD-10-CM | POA: Diagnosis not present

## 2024-03-08 LAB — COMPREHENSIVE METABOLIC PANEL WITH GFR
ALT: 17 U/L (ref 0–44)
AST: 27 U/L (ref 15–41)
Albumin: 3.4 g/dL — ABNORMAL LOW (ref 3.5–5.0)
Alkaline Phosphatase: 46 U/L (ref 38–126)
Anion gap: 12 (ref 5–15)
BUN: 10 mg/dL (ref 6–20)
CO2: 23 mmol/L (ref 22–32)
Calcium: 9.1 mg/dL (ref 8.9–10.3)
Chloride: 100 mmol/L (ref 98–111)
Creatinine, Ser: 0.71 mg/dL (ref 0.61–1.24)
GFR, Estimated: >60 mL/min (ref >60)
Glucose, Bld: 103 mg/dL — ABNORMAL HIGH (ref 70–99)
Potassium: 3.4 mmol/L — ABNORMAL LOW (ref 3.5–5.1)
Sodium: 135 mmol/L (ref 135–145)
Total Bilirubin: 0.5 mg/dL (ref 0.0–1.2)
Total Protein: 6.9 g/dL (ref 6.5–8.1)

## 2024-03-08 LAB — CBC
HCT: 40 % (ref 39.0–52.0)
Hemoglobin: 13.1 g/dL (ref 13.0–17.0)
MCH: 32 pg (ref 26.0–34.0)
MCHC: 32.8 g/dL (ref 30.0–36.0)
MCV: 97.6 fL (ref 80.0–100.0)
Platelets: 274 10*3/uL (ref 150–400)
RBC: 4.1 MIL/uL — ABNORMAL LOW (ref 4.22–5.81)
RDW: 18.6 % — ABNORMAL HIGH (ref 11.5–15.5)
WBC: 7.9 10*3/uL (ref 4.0–10.5)
nRBC: 0 % (ref 0.0–0.2)

## 2024-03-08 LAB — I-STAT CHEM 8, ED
BUN: 10 mg/dL (ref 6–20)
Calcium, Ion: 1.06 mmol/L — ABNORMAL LOW (ref 1.15–1.40)
Chloride: 97 mmol/L — ABNORMAL LOW (ref 98–111)
Creatinine, Ser: 1 mg/dL (ref 0.61–1.24)
Glucose, Bld: 101 mg/dL — ABNORMAL HIGH (ref 70–99)
HCT: 43 % (ref 39.0–52.0)
Hemoglobin: 14.6 g/dL (ref 13.0–17.0)
Potassium: 3.3 mmol/L — ABNORMAL LOW (ref 3.5–5.1)
Sodium: 135 mmol/L (ref 135–145)
TCO2: 23 mmol/L (ref 22–32)

## 2024-03-08 LAB — PROTIME-INR
INR: 1.2 (ref 0.8–1.2)
Prothrombin Time: 15.3 s — ABNORMAL HIGH (ref 11.4–15.2)

## 2024-03-08 LAB — SAMPLE TO BLOOD BANK

## 2024-03-08 LAB — I-STAT CG4 LACTIC ACID, ED: Lactic Acid, Venous: 1.6 mmol/L (ref 0.5–1.9)

## 2024-03-08 LAB — ETHANOL: Alcohol, Ethyl (B): 254 mg/dL — ABNORMAL HIGH (ref <15)

## 2024-03-08 MED ORDER — LACTATED RINGERS IV BOLUS
1000.0000 mL | Freq: Once | INTRAVENOUS | Status: AC
  Administered 2024-03-08: 1000 mL via INTRAVENOUS

## 2024-03-08 MED ORDER — SODIUM CHLORIDE 0.9% FLUSH
3.0000 mL | Freq: Two times a day (BID) | INTRAVENOUS | Status: DC
  Administered 2024-03-09: 3 mL via INTRAVENOUS

## 2024-03-08 MED ORDER — CARVEDILOL 3.125 MG PO TABS
6.2500 mg | ORAL_TABLET | Freq: Two times a day (BID) | ORAL | Status: DC
  Administered 2024-03-09: 6.25 mg via ORAL
  Filled 2024-03-08: qty 2

## 2024-03-08 MED ORDER — TAMSULOSIN HCL 0.4 MG PO CAPS: 0.4000 mg | ORAL_CAPSULE | Freq: Every day | ORAL | Status: DC

## 2024-03-08 MED ORDER — SODIUM CHLORIDE 0.9% FLUSH: 3.0000 mL | INTRAVENOUS | Status: DC | PRN

## 2024-03-08 MED ORDER — ONDANSETRON HCL 4 MG/2ML IJ SOLN: 4.0000 mg | Freq: Four times a day (QID) | INTRAMUSCULAR | Status: DC | PRN

## 2024-03-08 MED ORDER — OXYMETAZOLINE HCL 0.05 % NA SOLN
1.0000 | Freq: Once | NASAL | Status: AC
  Administered 2024-03-08: 1 via NASAL
  Filled 2024-03-08: qty 30

## 2024-03-08 MED ORDER — IOHEXOL 350 MG/ML SOLN
75.0000 mL | Freq: Once | INTRAVENOUS | Status: AC | PRN
  Administered 2024-03-08: 75 mL via INTRAVENOUS

## 2024-03-08 MED ORDER — CARVEDILOL 3.125 MG PO TABS: 6.2500 mg | ORAL_TABLET | Freq: Two times a day (BID) | ORAL | Status: DC

## 2024-03-08 MED ORDER — LORAZEPAM 2 MG/ML IJ SOLN: 1.0000 mg | INTRAMUSCULAR | Status: DC | PRN

## 2024-03-08 MED ORDER — ONDANSETRON HCL 4 MG PO TABS: 4.0000 mg | ORAL_TABLET | Freq: Four times a day (QID) | ORAL | Status: DC | PRN

## 2024-03-08 MED ORDER — INSULIN ASPART 100 UNIT/ML IJ SOLN: 0.0000 [IU] | Freq: Every day | INTRAMUSCULAR | Status: DC

## 2024-03-08 MED ORDER — ADULT MULTIVITAMIN W/MINERALS CH
1.0000 | ORAL_TABLET | Freq: Every day | ORAL | Status: DC
  Administered 2024-03-09: 1 via ORAL
  Filled 2024-03-08: qty 1

## 2024-03-08 MED ORDER — SODIUM CHLORIDE 0.9 % IV SOLN
250.0000 mL | INTRAVENOUS | Status: DC | PRN
Start: 2024-03-08 — End: 2024-03-09

## 2024-03-08 MED ORDER — LORAZEPAM 1 MG PO TABS: 1.0000 mg | ORAL_TABLET | ORAL | Status: DC | PRN

## 2024-03-08 MED ORDER — THIAMINE MONONITRATE 100 MG PO TABS
100.0000 mg | ORAL_TABLET | Freq: Every day | ORAL | Status: DC
  Administered 2024-03-09: 100 mg via ORAL
  Filled 2024-03-08: qty 1

## 2024-03-08 MED ORDER — THIAMINE HCL 100 MG/ML IJ SOLN: 100.0000 mg | Freq: Every day | INTRAMUSCULAR | Status: DC

## 2024-03-08 MED ORDER — TETANUS-DIPHTH-ACELL PERTUSSIS 5-2.5-18.5 LF-MCG/0.5 IM SUSY
0.5000 mL | PREFILLED_SYRINGE | Freq: Once | INTRAMUSCULAR | Status: AC
  Administered 2024-03-08: 0.5 mL via INTRAMUSCULAR
  Filled 2024-03-08: qty 0.5

## 2024-03-08 MED ORDER — ACETAMINOPHEN 650 MG RE SUPP: 650.0000 mg | Freq: Four times a day (QID) | RECTAL | Status: DC | PRN

## 2024-03-08 MED ORDER — FOLIC ACID 1 MG PO TABS: 1.0000 mg | ORAL_TABLET | Freq: Every day | ORAL | Status: DC

## 2024-03-08 MED ORDER — ACETAMINOPHEN 325 MG PO TABS
650.0000 mg | ORAL_TABLET | Freq: Four times a day (QID) | ORAL | Status: DC | PRN
  Administered 2024-03-09: 650 mg via ORAL
  Filled 2024-03-08: qty 2

## 2024-03-08 MED ORDER — NICOTINE 7 MG/24HR TD PT24
7.0000 mg | MEDICATED_PATCH | Freq: Every day | TRANSDERMAL | Status: DC
  Filled 2024-03-08: qty 1

## 2024-03-08 MED ORDER — INSULIN ASPART 100 UNIT/ML IJ SOLN: 0.0000 [IU] | Freq: Three times a day (TID) | INTRAMUSCULAR | Status: DC

## 2024-03-08 NOTE — ED Notes (Addendum)
 Trauma Response Nurse Documentation   Riley Ward is a 58 y.o. male arriving to [facility] ED via EMS  On Xarelto  (rivaroxaban ) daily. Trauma was activated as a Level 2 by ED charge RN based on the following trauma criteria Elderly patients > 65 with head trauma on anti-coagulation (excluding ASA). Fall down flight of stairs (4-5 stairs) Patient cleared for CT by Dr. Delana Favors EDP. Pt transported to CT with trauma response nurse present to monitor. RN remained with the patient throughout their absence from the department for clinical observation.   GCS 15.   History   Past Medical History:  Diagnosis Date   Alcohol dependence (HCC)    BPH (benign prostatic hyperplasia)    Chronic diastolic (congestive) heart failure (HCC)    DVT (deep venous thrombosis) (HCC)    Hypertension    Tobacco dependence      Past Surgical History:  Procedure Laterality Date   NO PAST SURGERIES         Initial Focused Assessment (If applicable, or please see trauma documentation): Alert/oriented male presents via EMS from home after a fall down several stairs. Bleeding from nares (now resolved) multiple hematomas to head, abrasions to head. ETOH on board.  Airway patent, BS clear No uncontrolled hemorrhage at this time GCS 15 Left pupil 5 right pupil 3 pt does not know if this is baseline, both brisk and round  CT's Completed:   CT Head, CT C-Spine, CT Chest w/ contrast, and CT abdomen/pelvis w/ contrast   Interventions:  Trauma lab draw Portable chest and pelvis XRAY CT head, c-spine, CAP  Plan for disposition:  D/C  Consults completed:  none  Event Summary: Presents via EMS from home after a fall down 4-5 stairs. Head with multiple hematomas and abrasions. On xarelto . Reports he drinks 2 40 oz beers daily, states he also had a couple mixed drinks tonight. Escorted to CT following XRAYS. Trauma scans unremarkable, anticipate D/C home.  MTP Summary (If applicable): NA  Bedside handoff with ED  RN Riley Ward.    Riley Ward O Riley Ward  Trauma Response RN  Please call TRN at [phone] for further assistance.

## 2024-03-08 NOTE — ED Provider Notes (Signed)
 [place] EMERGENCY DEPARTMENT AT [facility] Provider Note   CSN: [id number] Arrival date & time: 03/08/24  2122     History  Chief Complaint  Patient presents with   Riley Ward is a 58 y.o. male hx of alcohol abuse, here with fall. Patient drank about 4 drinks. He then fell down one flight of stairs. He was found altered by grand daughter and had nose bleed. Patient is on eliquis and level 2 trauma activated.   The history is provided by the patient.       Home Medications Prior to Admission medications   Medication Sig Start Date End Date Taking? Authorizing Provider  carvedilol  (COREG ) 6.25 MG tablet Take 1 tablet (6.25 mg total) by mouth 2 (two) times daily with a meal. 01/27/23   Deforest Fast, MD  rivaroxaban  (XARELTO ) 20 MG TABS tablet Take 20 mg by mouth daily. 03/27/19 02/01/23  [provider]  thiamine  (VITAMIN B-1) 100 MG tablet Take 1 tablet (100 mg total) by mouth daily. 01/27/23   Deforest Fast, MD      Allergies    Patient has no known allergies.    Review of Systems   Review of Systems  HENT:  Positive for nosebleeds.   All other systems reviewed and are negative.   Physical Exam Updated Vital Signs BP 136/82   Pulse 76   Temp 98.1 F (36.7 C) (Oral)   Resp (!) 25   Ht 5\' 11"  (1.803 m)   Wt 86.2 kg   SpO2 96%   BMI 26.50 kg/m  Physical Exam Vitals and nursing note reviewed.  Constitutional:      Comments: Intoxicated   HENT:     Head:     Comments: R scalp hematoma     Nose: Nose normal.     Comments: Nosebleed from R nostril     Mouth/Throat:     Mouth: Mucous membranes are moist.  Eyes:     Extraocular Movements: Extraocular movements intact.     Pupils: Pupils are equal, round, and reactive to light.  Cardiovascular:     Rate and Rhythm: Normal rate and regular rhythm.     Pulses: Normal pulses.     Heart sounds: Normal heart sounds.  Pulmonary:     Effort: Pulmonary effort is normal.     Breath  sounds: Normal breath sounds.  Abdominal:     Comments: Distended   Musculoskeletal:        General: Normal range of motion.     Cervical back: Normal range of motion and neck supple.  Skin:    General: Skin is warm.     Capillary Refill: Capillary refill takes less than 2 seconds.  Neurological:     General: No focal deficit present.  Psychiatric:        Mood and Affect: Mood normal.     ED Results / Procedures / Treatments   Labs (all labs ordered are listed, but only abnormal results are displayed) Labs Reviewed  COMPREHENSIVE METABOLIC PANEL WITH GFR - Abnormal; Notable for the following components:      Result Value   Potassium 3.4 (*)    Glucose, Bld 103 (*)    Albumin 3.4 (*)    All other components within normal limits  CBC - Abnormal; Notable for the following components:   RBC 4.10 (*)    RDW 18.6 (*)    All other components within normal limits  ETHANOL - Abnormal;  Notable for the following components:   Alcohol, Ethyl (B) 254 (*)    All other components within normal limits  PROTIME-INR - Abnormal; Notable for the following components:   Prothrombin Time 15.3 (*)    All other components within normal limits  I-STAT CHEM 8, ED - Abnormal; Notable for the following components:   Potassium 3.3 (*)    Chloride 97 (*)    Glucose, Bld 101 (*)    Calcium, Ion 1.06 (*)    All other components within normal limits  URINALYSIS, ROUTINE W REFLEX MICROSCOPIC  I-STAT CG4 LACTIC ACID, ED  SAMPLE TO BLOOD BANK    EKG None  Radiology CT CHEST ABDOMEN PELVIS W CONTRAST Result Date: 03/08/2024 CLINICAL DATA:  Recent fall with chest and abdominal pain, initial encounter EXAM: CT CHEST, ABDOMEN, AND PELVIS WITH CONTRAST TECHNIQUE: Multidetector CT imaging of the chest, abdomen and pelvis was performed following the standard protocol during bolus administration of intravenous contrast. RADIATION DOSE REDUCTION: This exam was performed according to the departmental  dose-optimization program which includes automated exposure control, adjustment of the mA and/or kV according to patient size and/or use of iterative reconstruction technique. CONTRAST:  75mL OMNIPAQUE  IOHEXOL  350 MG/ML SOLN COMPARISON:  None FINDINGS: CT CHEST FINDINGS Cardiovascular: Mild atherosclerotic calcifications of the ascending aorta are noted. Ascending aorta is dilated to 4.3 cm. No dissection is noted. Normal tapering in aortic arch is seen. Pulmonary artery shows no large central pulmonary embolism. Heart is at the upper limits of normal in size. Scattered coronary calcifications are noted. Mediastinum/Nodes: Thoracic inlet is within normal limits. No hilar or mediastinal adenopathy is noted. The esophagus as visualized is within normal limits. Lungs/Pleura: Lungs are well aerated bilaterally. No focal infiltrate or sizable effusion is seen. Mild emphysematous changes are noted. Musculoskeletal: No acute rib abnormality is noted. Mild degenerative changes of the thoracic spine are seen. CT ABDOMEN PELVIS FINDINGS Hepatobiliary: No focal liver abnormality is seen. No gallstones, gallbladder wall thickening, or biliary dilatation. Pancreas: Unremarkable. No pancreatic ductal dilatation or surrounding inflammatory changes. Spleen: Normal in size without focal abnormality. Adrenals/Urinary Tract: Adrenal glands are within normal limits. Kidneys demonstrate a normal enhancement pattern bilaterally. Nonobstructing lower pole right renal stone is noted measuring 5 mm. Ureters are within normal limits. No obstructive changes are seen. The bladder is well distended. Stomach/Bowel: No obstructive or inflammatory changes of the colon are noted. The appendix is within normal limits. Small bowel and stomach are unremarkable. Vascular/Lymphatic: Atherosclerotic calcifications of the aorta are noted without aneurysmal dilatation. No dissection is seen. No lymphadenopathy is noted. Reproductive: Prostate is  unremarkable. Other: No abdominal wall hernia or abnormality. No abdominopelvic ascites. Musculoskeletal: No acute or significant osseous findings. IMPRESSION: CT of the chest: Dilatation of the ascending aorta to 4.3 cm. Recommend annual imaging followup by CTA or MRA. This recommendation follows 2010 ACCF/AHA/AATS/ACR/ASA/SCA/SCAI/SIR/STS/SVM Guidelines for the Diagnosis and Management of Patients with Thoracic Aortic Disease. Circulation. 2010; 121: Z610-R604. Aortic aneurysm NOS (ICD10-I71.9) No acute abnormality noted. CT of the abdomen and pelvis: 5 mm lower pole right renal stone without obstructive change. No other focal abnormality is noted. Aortic Atherosclerosis (ICD10-I70.0) and Emphysema (ICD10-J43.9). Electronically Signed   By: Violeta Grey M.D.   On: 03/08/2024 22:12   CT HEAD WO CONTRAST Result Date: 03/08/2024 CLINICAL DATA:  Recent fall with headaches and neck pain, initial encounter EXAM: CT HEAD WITHOUT CONTRAST CT CERVICAL SPINE WITHOUT CONTRAST TECHNIQUE: Multidetector CT imaging of the head and cervical spine was performed  following the standard protocol without intravenous contrast. Multiplanar CT image reconstructions of the cervical spine were also generated. RADIATION DOSE REDUCTION: This exam was performed according to the departmental dose-optimization program which includes automated exposure control, adjustment of the mA and/or kV according to patient size and/or use of iterative reconstruction technique. COMPARISON:  01/25/2023 FINDINGS: CT HEAD FINDINGS Brain: No evidence of acute infarction, hemorrhage, hydrocephalus, extra-axial collection or mass lesion/mass effect. Vascular: No hyperdense vessel or unexpected calcification. Skull: Normal. Negative for fracture or focal lesion. Sinuses/Orbits: No acute finding. Other: Mild right frontal scalp hematoma is noted. CT CERVICAL SPINE FINDINGS Alignment: Within normal limits. Skull base and vertebrae: 7 cervical segments are well  visualized. 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 No acute fracture or acute facet abnormality is noted. Multilevel osteophytic changes are seen. The odontoid is within normal limits. Soft tissues and spinal canal: Surrounding soft tissue structures are within normal limits. Upper chest: Visualized lung apices are unremarkable. Other: None IMPRESSION: CT of the head: Mild right frontal scalp hematoma. No acute intracranial abnormality noted. CT of the cervical spine: Mild degenerative change without acute abnormality. Electronically Signed   By: Violeta Grey M.D.   On: 03/08/2024 22:04   CT CERVICAL SPINE WO CONTRAST Result Date: 03/08/2024 CLINICAL DATA:  Recent fall with headaches and neck pain, initial encounter EXAM: CT HEAD WITHOUT CONTRAST CT CERVICAL SPINE WITHOUT CONTRAST TECHNIQUE: Multidetector CT imaging of the head and cervical spine was performed following the standard protocol without intravenous contrast. Multiplanar CT image reconstructions of the cervical spine were also generated. RADIATION DOSE REDUCTION: This exam was performed according to the departmental dose-optimization program which includes automated exposure control, adjustment of the mA and/or kV according to patient size and/or use of iterative reconstruction technique. COMPARISON:  01/25/2023 FINDINGS: CT HEAD FINDINGS Brain: No evidence of acute infarction, hemorrhage, hydrocephalus, extra-axial collection or mass lesion/mass effect. Vascular: No hyperdense vessel or unexpected calcification. Skull: Normal. Negative for fracture or focal lesion. Sinuses/Orbits: No acute finding. Other: Mild right frontal scalp hematoma is noted. CT CERVICAL SPINE FINDINGS Alignment: Within normal limits. Skull base and vertebrae: 7 cervical segments are well visualized. 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 No acute fracture or acute facet abnormality is noted. Multilevel osteophytic changes are seen.  The odontoid is within normal limits. Soft tissues and spinal canal: Surrounding soft tissue structures are within normal limits. Upper chest: Visualized lung apices are unremarkable. Other: None IMPRESSION: CT of the head: Mild right frontal scalp hematoma. No acute intracranial abnormality noted. CT of the cervical spine: Mild degenerative change without acute abnormality. Electronically Signed   By: Violeta Grey M.D.   On: 03/08/2024 22:04   DG Pelvis Portable Result Date: 03/08/2024 CLINICAL DATA:  Recent fall with pelvic pain, initial encounter EXAM: PORTABLE PELVIS 1 VIEWS COMPARISON:  None Available. FINDINGS: There is no evidence of pelvic fracture or diastasis. No pelvic bone lesions are seen. IMPRESSION: No acute abnormality noted. Electronically Signed   By: Violeta Grey M.D.   On: 03/08/2024 21:38   DG Chest Port 1 View Result Date: 03/08/2024 CLINICAL DATA:  Recent fall while on blood thinners, initial encounter EXAM: PORTABLE CHEST 1 VIEW COMPARISON:  01/25/2023 FINDINGS: Cardiac shadow remains enlarged. Aortic calcifications are seen. The lungs are hypo inflated but clear. No bony abnormality is noted. IMPRESSION: No active disease. Electronically Signed   By: Violeta Grey M.D.   On: 03/08/2024 21:38    Procedures  Epistaxis Management  Date/Time: 03/08/2024 11:18 PM  Performed by: Dalene Duck, MD Authorized by: Dalene Duck, MD   Consent:    Consent obtained:  Verbal   Consent given by:  Patient   Risks, benefits, and alternatives were discussed: yes     Risks discussed:  Bleeding   Alternatives discussed:  No treatment Procedure details:    Treatment site:  R anterior   Treatment method:  Anterior pack   Treatment complexity:  Limited Post-procedure details:    Assessment:  Bleeding stopped   Procedure completion:  Tolerated       Medications Ordered in ED Medications  Tdap (BOOSTRIX) injection 0.5 mL (0.5 mLs Intramuscular Given 03/08/24 2217)  lactated  ringers  bolus 1,000 mL (1,000 mLs Intravenous New Bag/Given 03/08/24 2137)  iohexol  (OMNIPAQUE ) 350 MG/ML injection 75 mL (75 mLs Intravenous Contrast Given 03/08/24 2155)  oxymetazoline (AFRIN) 0.05 % nasal spray 1 spray (1 spray Each Nare Given 03/08/24 2216)    ED Course/ Medical Decision Making/ A&P                                 Medical Decision Making Garret Oakley is a 58 y.o. male here with alcohol intoxication, fall. Patient is on eliquis so level 2 trauma activated. Patient appears intoxicated. Will do trauma scan. Will also give afrin for R epistaxis    11:14 PM ETOH is 250. Trauma scan showed no bleeding or fractures. Patient is too intoxicated to stand up. Talked to family at bedside. They request admission for observation given that he is on blood thinners and is still intoxicated. Hospitalist to admit.   Problems Addressed: Epistaxis: acute illness or injury Fall, initial encounter: acute illness or injury  Amount and/or Complexity of Data Reviewed Labs: ordered. Decision-making details documented in ED Course. Radiology: ordered and independent interpretation performed. Decision-making details documented in ED Course.  Risk OTC drugs. Prescription drug management.    Final Clinical Impression(s) / ED Diagnoses Final diagnoses:  Fall, initial encounter    Rx / DC Orders ED Discharge Orders     None         Dalene Duck, MD 03/08/24 2319

## 2024-03-08 NOTE — Progress Notes (Addendum)
   03/08/24 2130  Spiritual Encounters  Type of Visit Initial  Care provided to: Patient  Conversation partners present during encounter Nurse  Referral source Trauma page  Reason for visit Trauma  OnCall Visit No  Interventions  Spiritual Care Interventions Made Established relationship of care and support  Intervention Outcomes  Outcomes Connection to spiritual care    Chaplain responded to Level 2 page. No spiritual needs at this time. Pt stated his motivation to attend his granddaughter's graduation this week. Chaplain remains available.

## 2024-03-08 NOTE — H&P (Incomplete)
 History and Physical    Riley Ward ZOX:[id number] DOB: [birth year] DOA: 03/08/2024  PCP: [facility]   Patient coming from: Home   Chief Complaint:  Chief Complaint  Patient presents with   Fall   ED TRIAGE note:  HPI:  Riley Ward is a 58 y.o. male with medical history of chronic diastolic heart failure, BPH, essential hypertension, non-insulin-dependent DM type II, DVT in 2020 on Xarelto , renal cyst, chronic smoking cigarette and chronic alcohol use presented emergency department for evaluation for mechanical fall.  Patient reported he had few drinks today and fell down from 1 flight of stairs.  He was found out by patient's granddaughter and found to have some nosebleeding.  Due to my evaluation at the bedside patient is alert oriented x 4.  Stated that nasal bleeding has been completely stopped in the ED.  Patient denies any headache, dizziness, nausea, vomiting, abdominal pain, chest pain, palpitation, tremor, and visual/auditory hallucination.  No other complaint at this time.  ED Course:  At presentation to ED patient is hemodynamically stable. CMP showing low potassium 3.4 and low albumin 3.4 otherwise unremarkable. CBC normal WBC, normal platelet count.  Stable H&H 13.1 and 40. Blood alcohol level 254. Lactic acid within normal range.  Given patient presented to ED exam level 2 trauma extensive imaging has been obtained. CTA chest/abdomen/pelvis no evidence of fracture and acute abnormality. CT chest showed incidental finding of dilation of the abdominal aorta 4.3 cm recommended annual imaging followed by CTA or MRA.  CT cervical spine no evidence of fracture. CTA head showed mild right frontal scalp hematoma. Chest x-ray no active disease process. X-ray pelvis no acute abnormality/fracture.  In the ED patient has been given 1 L of LR bolus and nasal Afrin spray.  Also received Tdap vaccine.  Dr. Delana Favors reported that nasal bleeding has been stopped after placing  the nasal packing and Afrin nasal spray however patient has minor bleeding.  Family is worried that given patient is still intoxicated we will not want to take home. Per ED physician patient seems still intoxicated due to alcohol.  Hospitalist has been consulted for further evaluation management of acute metabolic encephalopathy in the setting of alcohol intoxication and concern for alcohol withdrawal.  Significant labs in the ED: Lab Orders         Comprehensive metabolic panel         CBC         Ethanol         Urinalysis, Routine w reflex microscopic -Urine, Clean Catch         Protime-INR         Comprehensive metabolic panel         CBC         Protime-INR         APTT         Ammonia         HIV Antibody (routine testing w rflx)         Hemoglobin A1c         I-Stat Chem 8, ED         I-Stat Lactic Acid, ED       Review of Systems:  Review of Systems  Constitutional:  Negative for chills, fever, malaise/fatigue and weight loss.  HENT:  Positive for nosebleeds. Negative for congestion, ear discharge and sinus pain.   Eyes:  Negative for blurred vision.  Respiratory:  Negative for cough and shortness of breath.   Cardiovascular:  Negative for chest pain and palpitations.  Gastrointestinal:  Negative for heartburn, nausea and vomiting.  Neurological:  Negative for dizziness and headaches.  Psychiatric/Behavioral:  The patient is not nervous/anxious.     Past Medical History:  Diagnosis Date   Alcohol dependence (HCC)    BPH (benign prostatic hyperplasia)    Chronic diastolic (congestive) heart failure (HCC)    DVT (deep venous thrombosis) (HCC)    Hypertension    Tobacco dependence     Past Surgical History:  Procedure Laterality Date   NO PAST SURGERIES       reports that he has been smoking. He has never used smokeless tobacco. He reports current alcohol use. He reports that he does not use drugs.  No Known Allergies  Family History  Problem Relation Age  of Onset   Colon cancer Neg Hx    Stomach cancer Neg Hx     Prior to Admission medications   Medication Sig Start Date End Date Taking? Authorizing Provider  carvedilol  (COREG ) 6.25 MG tablet Take 1 tablet (6.25 mg total) by mouth 2 (two) times daily with a meal. 01/27/23   Deforest Fast, MD  rivaroxaban  (XARELTO ) 20 MG TABS tablet Take 20 mg by mouth daily. 03/27/19 02/01/23  [provider]  thiamine  (VITAMIN B-1) 100 MG tablet Take 1 tablet (100 mg total) by mouth daily. 01/27/23   Deforest Fast, MD     Physical Exam: Vitals:   03/08/24 2230 03/08/24 2245 03/08/24 2300 03/08/24 2315  BP: 137/79 (!) 153/85 121/68 107/81  Pulse: 80 86 83 76  Resp: 12 (!) 21 16 15   Temp:      TempSrc:      SpO2: 100% 95% 97% 100%  Weight:      Height:        Physical Exam Vitals reviewed.  Constitutional:      General: He is not in acute distress.    Appearance: He is not ill-appearing.  HENT:     Nose: Nose normal.     Mouth/Throat:     Mouth: Mucous membranes are moist.  Eyes:     Pupils: Pupils are equal, round, and reactive to light.  Cardiovascular:     Rate and Rhythm: Normal rate and regular rhythm.     Pulses: Normal pulses.     Heart sounds: Normal heart sounds.  Pulmonary:     Effort: Pulmonary effort is normal.     Breath sounds: Normal breath sounds.  Abdominal:     General: Bowel sounds are normal.  Musculoskeletal:     Cervical back: Normal range of motion and neck supple.     Right lower leg: No edema.     Left lower leg: No edema.  Skin:    Capillary Refill: Capillary refill takes less than 2 seconds.     Findings: No bruising.  Neurological:     Mental Status: He is alert and oriented to person, place, and time.  Psychiatric:        Mood and Affect: Mood normal.      Labs on Admission: I have personally reviewed following labs and imaging studies  CBC: Recent Labs  Lab 03/08/24 2124 03/08/24 2135  WBC 7.9  --   HGB 13.1 14.6  HCT 40.0 43.0   MCV 97.6  --   PLT 274  --    Basic Metabolic Panel: Recent Labs  Lab 03/08/24 2124 03/08/24 2135  NA 135 135  K 3.4* 3.3*  CL 100 97*  CO2 23  --   GLUCOSE 103* 101*  BUN 10 10  CREATININE 0.71 1.00  CALCIUM 9.1  --    GFR: Estimated Creatinine Clearance: 88.9 mL/min (by C-G formula based on SCr of 1 mg/dL). Liver Function Tests: Recent Labs  Lab 03/08/24 2124  AST 27  ALT 17  ALKPHOS 46  BILITOT 0.5  PROT 6.9  ALBUMIN 3.4*   No results for input(s): "LIPASE", "AMYLASE" in the last 168 hours. No results for input(s): "AMMONIA" in the last 168 hours. Coagulation Profile: Recent Labs  Lab 03/08/24 2124  INR 1.2   Cardiac Enzymes: No results for input(s): "CKTOTAL", "CKMB", "CKMBINDEX", "TROPONINI", "TROPONINIHS" in the last 168 hours. BNP (last 3 results) No results for input(s): "BNP" in the last 8760 hours. HbA1C: No results for input(s): "HGBA1C" in the last 72 hours. CBG: No results for input(s): "GLUCAP" in the last 168 hours. Lipid Profile: No results for input(s): "CHOL", "HDL", "LDLCALC", "TRIG", "CHOLHDL", "LDLDIRECT" in the last 72 hours. Thyroid Function Tests: No results for input(s): "TSH", "T4TOTAL", "FREET4", "T3FREE", "THYROIDAB" in the last 72 hours. Anemia Panel: No results for input(s): "VITAMINB12", "FOLATE", "FERRITIN", "TIBC", "IRON", "RETICCTPCT" in the last 72 hours. Urine analysis:    Component Value Date/Time   COLORURINE YELLOW 01/25/2023 0426   APPEARANCEUR CLEAR 01/25/2023 0426   LABSPEC 1.016 01/25/2023 0426   PHURINE 5.0 01/25/2023 0426   GLUCOSEU NEGATIVE 01/25/2023 0426   HGBUR MODERATE (A) 01/25/2023 0426   BILIRUBINUR NEGATIVE 01/25/2023 0426   KETONESUR NEGATIVE 01/25/2023 0426   PROTEINUR NEGATIVE 01/25/2023 0426   NITRITE NEGATIVE 01/25/2023 0426   LEUKOCYTESUR NEGATIVE 01/25/2023 0426    Radiological Exams on Admission: I have personally reviewed images CT CHEST ABDOMEN PELVIS W CONTRAST Result Date:  03/08/2024 CLINICAL DATA:  Recent fall with chest and abdominal pain, initial encounter EXAM: CT CHEST, ABDOMEN, AND PELVIS WITH CONTRAST TECHNIQUE: Multidetector CT imaging of the chest, abdomen and pelvis was performed following the standard protocol during bolus administration of intravenous contrast. RADIATION DOSE REDUCTION: This exam was performed according to the departmental dose-optimization program which includes automated exposure control, adjustment of the mA and/or kV according to patient size and/or use of iterative reconstruction technique. CONTRAST:  75mL OMNIPAQUE  IOHEXOL  350 MG/ML SOLN COMPARISON:  None FINDINGS: CT CHEST FINDINGS Cardiovascular: Mild atherosclerotic calcifications of the ascending aorta are noted. Ascending aorta is dilated to 4.3 cm. No dissection is noted. Normal tapering in aortic arch is seen. Pulmonary artery shows no large central pulmonary embolism. Heart is at the upper limits of normal in size. Scattered coronary calcifications are noted. Mediastinum/Nodes: Thoracic inlet is within normal limits. No hilar or mediastinal adenopathy is noted. The esophagus as visualized is within normal limits. Lungs/Pleura: Lungs are well aerated bilaterally. No focal infiltrate or sizable effusion is seen. Mild emphysematous changes are noted. Musculoskeletal: No acute rib abnormality is noted. Mild degenerative changes of the thoracic spine are seen. CT ABDOMEN PELVIS FINDINGS Hepatobiliary: No focal liver abnormality is seen. No gallstones, gallbladder wall thickening, or biliary dilatation. Pancreas: Unremarkable. No pancreatic ductal dilatation or surrounding inflammatory changes. Spleen: Normal in size without focal abnormality. Adrenals/Urinary Tract: Adrenal glands are within normal limits. Kidneys demonstrate a normal enhancement pattern bilaterally. Nonobstructing lower pole right renal stone is noted measuring 5 mm. Ureters are within normal limits. No obstructive changes are  seen. The bladder is well distended. Stomach/Bowel: No obstructive or inflammatory changes of the colon are noted. The appendix is within normal limits. Small bowel and  stomach are unremarkable. Vascular/Lymphatic: Atherosclerotic calcifications of the aorta are noted without aneurysmal dilatation. No dissection is seen. No lymphadenopathy is noted. Reproductive: Prostate is unremarkable. Other: No abdominal wall hernia or abnormality. No abdominopelvic ascites. Musculoskeletal: No acute or significant osseous findings. IMPRESSION: CT of the chest: Dilatation of the ascending aorta to 4.3 cm. Recommend annual imaging followup by CTA or MRA. This recommendation follows 2010 ACCF/AHA/AATS/ACR/ASA/SCA/SCAI/SIR/STS/SVM Guidelines for the Diagnosis and Management of Patients with Thoracic Aortic Disease. Circulation. 2010; 121: U981-X914. Aortic aneurysm NOS (ICD10-I71.9) No acute abnormality noted. CT of the abdomen and pelvis: 5 mm lower pole right renal stone without obstructive change. No other focal abnormality is noted. Aortic Atherosclerosis (ICD10-I70.0) and Emphysema (ICD10-J43.9). Electronically Signed   By: Violeta Grey M.D.   On: 03/08/2024 22:12   CT HEAD WO CONTRAST Result Date: 03/08/2024 CLINICAL DATA:  Recent fall with headaches and neck pain, initial encounter EXAM: CT HEAD WITHOUT CONTRAST CT CERVICAL SPINE WITHOUT CONTRAST TECHNIQUE: Multidetector CT imaging of the head and cervical spine was performed following the standard protocol without intravenous contrast. Multiplanar CT image reconstructions of the cervical spine were also generated. RADIATION DOSE REDUCTION: This exam was performed according to the departmental dose-optimization program which includes automated exposure control, adjustment of the mA and/or kV according to patient size and/or use of iterative reconstruction technique. COMPARISON:  01/25/2023 FINDINGS: CT HEAD FINDINGS Brain: No evidence of acute infarction, hemorrhage,  hydrocephalus, extra-axial collection or mass lesion/mass effect. Vascular: No hyperdense vessel or unexpected calcification. Skull: Normal. Negative for fracture or focal lesion. Sinuses/Orbits: No acute finding. Other: Mild right frontal scalp hematoma is noted. CT CERVICAL SPINE FINDINGS Alignment: Within normal limits. Skull base and vertebrae: 7 cervical segments are well visualized. 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 No acute fracture or acute facet abnormality is noted. Multilevel osteophytic changes are seen. The odontoid is within normal limits. Soft tissues and spinal canal: Surrounding soft tissue structures are within normal limits. Upper chest: Visualized lung apices are unremarkable. Other: None IMPRESSION: CT of the head: Mild right frontal scalp hematoma. No acute intracranial abnormality noted. CT of the cervical spine: Mild degenerative change without acute abnormality. Electronically Signed   By: Violeta Grey M.D.   On: 03/08/2024 22:04   CT CERVICAL SPINE WO CONTRAST Result Date: 03/08/2024 CLINICAL DATA:  Recent fall with headaches and neck pain, initial encounter EXAM: CT HEAD WITHOUT CONTRAST CT CERVICAL SPINE WITHOUT CONTRAST TECHNIQUE: Multidetector CT imaging of the head and cervical spine was performed following the standard protocol without intravenous contrast. Multiplanar CT image reconstructions of the cervical spine were also generated. RADIATION DOSE REDUCTION: This exam was performed according to the departmental dose-optimization program which includes automated exposure control, adjustment of the mA and/or kV according to patient size and/or use of iterative reconstruction technique. COMPARISON:  01/25/2023 FINDINGS: CT HEAD FINDINGS Brain: No evidence of acute infarction, hemorrhage, hydrocephalus, extra-axial collection or mass lesion/mass effect. Vascular: No hyperdense vessel or unexpected calcification. Skull: Normal. Negative for fracture or focal  lesion. Sinuses/Orbits: No acute finding. Other: Mild right frontal scalp hematoma is noted. CT CERVICAL SPINE FINDINGS Alignment: Within normal limits. Skull base and vertebrae: 7 cervical segments are well visualized. 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 No acute fracture or acute facet abnormality is noted. Multilevel osteophytic changes are seen. The odontoid is within normal limits. Soft tissues and spinal canal: Surrounding soft tissue structures are within normal limits. Upper chest: Visualized lung apices  are unremarkable. Other: None IMPRESSION: CT of the head: Mild right frontal scalp hematoma. No acute intracranial abnormality noted. CT of the cervical spine: Mild degenerative change without acute abnormality. Electronically Signed   By: Violeta Grey M.D.   On: 03/08/2024 22:04   DG Pelvis Portable Result Date: 03/08/2024 CLINICAL DATA:  Recent fall with pelvic pain, initial encounter EXAM: PORTABLE PELVIS 1 VIEWS COMPARISON:  None Available. FINDINGS: There is no evidence of pelvic fracture or diastasis. No pelvic bone lesions are seen. IMPRESSION: No acute abnormality noted. Electronically Signed   By: Violeta Grey M.D.   On: 03/08/2024 21:38   DG Chest Port 1 View Result Date: 03/08/2024 CLINICAL DATA:  Recent fall while on blood thinners, initial encounter EXAM: PORTABLE CHEST 1 VIEW COMPARISON:  01/25/2023 FINDINGS: Cardiac shadow remains enlarged. Aortic calcifications are seen. The lungs are hypo inflated but clear. No bony abnormality is noted. IMPRESSION: No active disease. Electronically Signed   By: Violeta Grey M.D.   On: 03/08/2024 21:38     EKG:  Pending EKG    Assessment/Plan: Principal Problem:   Alcohol intoxication (HCC) Active Problems:   Acute metabolic encephalopathy   Abdominal aorta dilation   BPH (benign prostatic hyperplasia)   Essential hypertension   Chronic diastolic CHF (congestive heart failure) (HCC)   Scalp hematoma    Epistaxis-resolved   Non-insulin dependent type 2 diabetes mellitus (HCC)   History of DVT (deep vein thrombosis)   Continuous dependence on cigarette smoking    Assessment and Plan: Alcohol intoxication Chronic alcohol use Acute metabolic encephalopathy in the setting of alcohol use Mechanical fall due to alcohol intoxication -Patient presented emergency department secondary to mechanical fall from the stairs under the influence of alcohol.  At presentation to ED patient is hemodynamically stable.  Extensive workup in the ED included imaging no evidence of fracture and dislocation. - Found to have elevated blood alcohol level around 254. - Given patient is unsteady to his gait and high risk for fall again admitting patient for management of alcohol withdrawal.  Acute metabolic encephalopathy in the setting of alcohol intoxication.  Currently patient is alert oriented x 4 - Continue CIWA protocol, Ativan  as needed, thiamine , multivitamin and folic acid . -Counseled patient at the bedside for cessation of alcohol use.  Consulted transition care team  Scalp hematoma-secondary to fall Epistaxis-resolved -Nasal bleeding has been stopped Afrin nasal spray in the ED.  Patient also received tetanus booster.  History of extensive DVT of the bilateral lower extremity 2020 - Holding Xarelto  in the setting of recent epistaxis and scalp hematoma.  If patient does not redevelop epistaxis and hemoglobin remains stable can resume Xarelto .   Ascending aorta dilation -CT abdomen pelvis showed incidental finding of of dilation of ascending aorta 4.3 cm.  Radiology recommended annual imaging followed by CTA or MRA   Essential hypertension Chronic diastolic heart failure -Continue Coreg  twice daily.  BPH -Continue Flomax   Continues smoking cigarette -Continue nicotine  patch  Non-insulin insulin-dependent DM type II -Continue heart healthy carb modified diet.  DVT prophylaxis:  SCDs, TED hose.   Deferring pharmacologic DVT prophylaxis in the setting of nasal epistaxis. Code Status:  Full Code Diet: Heart healthy carb modified diet Family Communication:   Family was present at bedside, at the time of interview. Opportunity was given to ask question and all questions were answered satisfactorily.  Disposition Plan: Continue to monitor redevelopment of epistaxis and alcohol withdrawal.  Can resume Xarelto  once appropriate. Consults: Transition care team Admission  status:   Observation, Telemetry bed  Severity of Illness: The appropriate patient status for this patient is OBSERVATION. Observation status is judged to be reasonable and necessary in order to provide the required intensity of service to ensure the patient's safety. The patient's presenting symptoms, physical exam findings, and initial radiographic and laboratory data in the context of their medical condition is felt to place them at decreased risk for further clinical deterioration. Furthermore, it is anticipated that the patient will be medically stable for discharge from the hospital within 2 midnights of admission.     Riley Radu, MD [facility]  How to contact the [facility] Attending or Consulting provider 7A - 7P or covering provider during after hours 7P -7A, for this patient.  Check the care team in [facility] and look for a) attending/consulting [facility] provider listed and b) the [facility] team listed Log into [email or web address] and use 's universal password to access. If you do not have the password, please contact the hospital operator. Locate the [facility] provider you are looking for under [facility] and page to a number that you can be directly reached. If you still have difficulty reaching the provider, please page the [facility] (Director on Call) for the Hospitalists listed on amion for assistance.  03/09/2024, 12:17 AM

## 2024-03-09 DIAGNOSIS — F10920 Alcohol use, unspecified with intoxication, uncomplicated: Secondary | ICD-10-CM | POA: Diagnosis not present

## 2024-03-09 LAB — COMPREHENSIVE METABOLIC PANEL WITH GFR
ALT: 19 U/L (ref 0–44)
AST: 32 U/L (ref 15–41)
Albumin: 3.4 g/dL — ABNORMAL LOW (ref 3.5–5.0)
Alkaline Phosphatase: 44 U/L (ref 38–126)
Anion gap: 8 (ref 5–15)
BUN: 8 mg/dL (ref 6–20)
CO2: 27 mmol/L (ref 22–32)
Calcium: 8.9 mg/dL (ref 8.9–10.3)
Chloride: 102 mmol/L (ref 98–111)
Creatinine, Ser: 0.61 mg/dL (ref 0.61–1.24)
GFR, Estimated: >60 mL/min (ref >60)
Glucose, Bld: 85 mg/dL (ref 70–99)
Potassium: 3.8 mmol/L (ref 3.5–5.1)
Sodium: 137 mmol/L (ref 135–145)
Total Bilirubin: 0.4 mg/dL (ref 0.0–1.2)
Total Protein: 6.9 g/dL (ref 6.5–8.1)

## 2024-03-09 LAB — CBC
HCT: 39.3 % (ref 39.0–52.0)
Hemoglobin: 12.9 g/dL — ABNORMAL LOW (ref 13.0–17.0)
MCH: 31.9 pg (ref 26.0–34.0)
MCHC: 32.8 g/dL (ref 30.0–36.0)
MCV: 97.3 fL (ref 80.0–100.0)
Platelets: 302 10*3/uL (ref 150–400)
RBC: 4.04 MIL/uL — ABNORMAL LOW (ref 4.22–5.81)
RDW: 18.6 % — ABNORMAL HIGH (ref 11.5–15.5)
WBC: 8.5 10*3/uL (ref 4.0–10.5)
nRBC: 0 % (ref 0.0–0.2)

## 2024-03-09 LAB — CBG MONITORING, ED
Glucose-Capillary: 77 mg/dL (ref 70–99)
Glucose-Capillary: 96 mg/dL (ref 70–99)

## 2024-03-09 MED ORDER — TAMSULOSIN HCL 0.4 MG PO CAPS: 0.4000 mg | ORAL_CAPSULE | Freq: Every day | ORAL | Status: DC

## 2024-03-09 MED ORDER — NIFEDIPINE ER OSMOTIC RELEASE 60 MG PO TB24
90.0000 mg | ORAL_TABLET | Freq: Every day | ORAL | Status: DC
  Filled 2024-03-09: qty 1

## 2024-03-09 MED ORDER — PANTOPRAZOLE SODIUM 40 MG PO TBEC
40.0000 mg | DELAYED_RELEASE_TABLET | Freq: Every day | ORAL | Status: DC
  Administered 2024-03-09: 40 mg via ORAL
  Filled 2024-03-09: qty 1

## 2024-03-09 MED ORDER — VITAMIN D 25 MCG (1000 UNIT) PO TABS
2000.0000 [IU] | ORAL_TABLET | Freq: Every day | ORAL | Status: DC
  Administered 2024-03-09: 2000 [IU] via ORAL
  Filled 2024-03-09: qty 2

## 2024-03-09 MED ORDER — ATORVASTATIN CALCIUM 40 MG PO TABS
40.0000 mg | ORAL_TABLET | Freq: Every day | ORAL | Status: DC
  Administered 2024-03-09: 40 mg via ORAL
  Filled 2024-03-09: qty 1

## 2024-03-09 MED ORDER — FOLIC ACID 1 MG PO TABS
1.0000 mg | ORAL_TABLET | Freq: Every day | ORAL | Status: DC
  Administered 2024-03-09: 1 mg via ORAL
  Filled 2024-03-09: qty 1

## 2024-03-09 MED ORDER — FERROUS SULFATE 325 (65 FE) MG PO TABS
325.0000 mg | ORAL_TABLET | Freq: Every day | ORAL | Status: DC
  Administered 2024-03-09: 325 mg via ORAL
  Filled 2024-03-09: qty 1

## 2024-03-09 MED ORDER — SERTRALINE HCL 50 MG PO TABS
50.0000 mg | ORAL_TABLET | Freq: Every day | ORAL | Status: DC
  Administered 2024-03-09: 50 mg via ORAL
  Filled 2024-03-09: qty 1

## 2024-03-09 NOTE — Discharge Instructions (Signed)

## 2024-03-09 NOTE — Discharge Summary (Signed)
 @LOGO @   PT LEFT AMA SUMMARY  Riley Ward MRN - [id number] DOB - 04-07-1966  Date of Admission - 03/08/2024 Date LEFT AMA:   Attending Physician:  Modena Andes, MD  Patient's PCP:  [facility]  Disposition: LEFT AMA  Follow-up Appts:  Not able to be arranged or discussed as pt LEFT AMA  Diagnoses at time pt LEFT AMA: Alcohol intoxication/chronic alcohol use Acute metabolic encephalopathy in the setting of alcohol abuse. Mechanical fall due to alcohol intoxication. Epistaxis History of DVT. Incidental finding of ascending aortic dilatation.  Initial presentation: HPI:  Riley Ward is a 58 y.o. male with medical history of chronic diastolic heart failure, BPH, essential hypertension, non-insulin-dependent DM type II, DVT in 2020 on Xarelto , renal cyst, chronic smoking cigarette and chronic alcohol use presented emergency department for evaluation for mechanical fall.  Patient reported he had few drinks today and fell down from 1 flight of stairs.  He was found out by patient's granddaughter and found to have some nosebleeding.   Due to my evaluation at the bedside patient is alert oriented x 4.  Stated that nasal bleeding has been completely stopped in the ED.  Patient denies any headache, dizziness, nausea, vomiting, abdominal pain, chest pain, palpitation, tremor, and visual/auditory hallucination.   No other complaint at this time.   ED Course:  At presentation to ED patient is hemodynamically stable. CMP showing low potassium 3.4 and low albumin 3.4 otherwise unremarkable. CBC normal WBC, normal platelet count.  Stable H&H 13.1 and 40. Blood alcohol level 254. Lactic acid within normal range.   Given patient presented to ED exam level 2 trauma extensive imaging has been obtained. CTA chest/abdomen/pelvis no evidence of fracture and acute abnormality. CT chest showed incidental finding of dilation of the abdominal aorta 4.3 cm recommended annual imaging followed by  CTA or MRA.   CT cervical spine no evidence of fracture. CTA head showed mild right frontal scalp hematoma. Chest x-ray no active disease process. X-ray pelvis no acute abnormality/fracture.   In the ED patient has been given 1 L of LR bolus and nasal Afrin spray.  Also received Tdap vaccine.   Dr. Delana Favors reported that nasal bleeding has been stopped after placing the nasal packing and Afrin nasal spray however patient has minor bleeding.  Family is worried that given patient is still intoxicated we will not want to take home. Per ED physician patient seems still intoxicated due to alcohol.   Hospitalist has been consulted for further evaluation management of acute metabolic encephalopathy in the setting of alcohol intoxication and concern for alcohol withdrawal.  Hospital Course: Listed below are the active problems present, and the status of the care of these problems, at the time the pt decided to LEAVE AMA:  Alcohol intoxication Chronic alcohol use Acute metabolic encephalopathy in the setting of alcohol use Mechanical fall due to alcohol intoxication -Patient presented emergency department secondary to mechanical fall from the stairs under the influence of alcohol. Extensive workup in the ED included imaging no evidence of fracture and dislocation. elevated blood alcohol level around 254.  Encephalopathy resolved.  Very minimal withdrawal symptoms.  CIWA under 5.  Patient was on CIWA protocol.   Scalp hematoma-secondary to fall, hopefully this will resolve by itself.   Epistaxis-resolved -Nasal bleeding  stopped due to Afrin nasal spray in the ED.  Patient also received tetanus booster.   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   Ascending aorta dilation -CT abdomen pelvis showed incidental finding of of dilation of ascending aorta 4.3 cm.  Radiology recommended annual imaging followed by CTA or  MRA.  Defer to PCP.  This was discussed with the patient.   Essential hypertension: Controlled.  Continue Coreg .   Chronic diastolic heart failure Appears compensated.  Continue Coreg  twice daily.  Does not appear to be taking any diuretics.   BPH -Continue Flomax    Anemia of chronic disease: Baseline between 12-14.  Currently at baseline.   Continues smoking cigarette -Continue nicotine  patch.  I have discussed tobacco cessation with the patient.  I have counseled the patient regarding the negative impacts of continued tobacco use including but not limited to lung cancer, COPD, and cardiovascular disease.  I have discussed alternatives to tobacco and modalities that may help facilitate tobacco cessation including but not limited to biofeedback, hypnosis, and medications.  Total time spent with tobacco counseling was 5 minutes.   Non-insulin insulin-dependent DM type II - Resume PTA medications.   At risk of leaving AMA: Patient is very eager to go home.  Had a lengthy discussion with the patient that it is not recommended for him to go home yet since he needs further observation and management of all the issues mentioned above.  He verbalized understanding but continued to remain eager.  He was discouraged to leave AGAINST MEDICAL ADVICE.  Above/at risk of leaving AMA was documented in my progress note.  Patient was eager to go.  He was clearly informed that he was not medically stable for discharge.  2 hours later, he told the nurse that he would like to leave AGAINST MEDICAL ADVICE.    Medication List    Unable to be finalized as pt LEFT AMA  Day of Discharge Wt Readings from Last 3 Encounters:  03/08/24 86.2 kg  01/26/23 81.8 kg  04/13/14 79.8 kg   Temp Readings from Last 3 Encounters:  03/09/24 98.1 F (36.7 C) (Oral)  01/27/23 97.8 F (36.6 C) (Oral)   BP Readings from Last 3 Encounters:  03/09/24 (!) 153/95  01/27/23 114/70  04/13/14 138/80   Pulse Readings from  Last 3 Encounters:  03/09/24 73  01/26/23 81  04/13/14 82    Physical Exam: Exam not able to be completed at time of d/c as pt LEFT AMA  10:34 AM 03/09/24  Modena Andes, MD [facility] Office  [phone]

## 2024-03-09 NOTE — Progress Notes (Signed)
 CSW added substance abuse resources to patient's AVS.  Edwin Dada, MSW, LCSW Transitions of Care  Clinical Social Worker II [phone]

## 2024-03-09 NOTE — Progress Notes (Signed)
 Transition of Care [facility]) - CAGE-AID Screening   Patient Details  Name: Riley Ward MRN: [id number] Date of Birth: [birth year]  [facility]) CM/SW Contact:    Marvis Sluder, RN Phone Number: 03/09/2024, 2:34 AM   Clinical Narrative:  Reports he drinks 2 40 oz beers a day, adds additional alcohol occasionally. Reports some additional shots of liquor PTA. Denies drug use. States that he doesn't go into withdrawals if he skips a day.   CAGE-AID Screening:    Have You Ever Felt You Ought to Cut Down on Your Drinking or Drug Use?: No Have People Annoyed You By Critizing Your Drinking Or Drug Use?: Yes Have You Felt Bad Or Guilty About Your Drinking Or Drug Use?: No Have You Ever Had a Drink or Used Drugs First Thing In The Morning to Steady Your Nerves or to Get Rid of a Hangover?: No CAGE-AID Score: 1  Substance Abuse Education Offered: Yes  Substance abuse interventions:  (resources placed on AVS)

## 2024-03-09 NOTE — Progress Notes (Signed)
 PROGRESS NOTE    Riley Ward  NWG:[id number] DOB: 1966-04-17 DOA: 03/08/2024 PCP: [facility]   Brief Narrative:  HPI:  Riley Ward is a 58 y.o. male with medical history of chronic diastolic heart failure, BPH, essential hypertension, non-insulin-dependent DM type II, DVT in 2020 on Xarelto , renal cyst, chronic smoking cigarette and chronic alcohol use presented emergency department for evaluation for mechanical fall.  Patient reported he had few drinks today and fell down from 1 flight of stairs.  He was found out by patient's granddaughter and found to have some nosebleeding.   Due to my evaluation at the bedside patient is alert oriented x 4.  Stated that nasal bleeding has been completely stopped in the ED.  Patient denies any headache, dizziness, nausea, vomiting, abdominal pain, chest pain, palpitation, tremor, and visual/auditory hallucination.   No other complaint at this time.   ED Course:  At presentation to ED patient is hemodynamically stable. CMP showing low potassium 3.4 and low albumin 3.4 otherwise unremarkable. CBC normal WBC, normal platelet count.  Stable H&H 13.1 and 40. Blood alcohol level 254. Lactic acid within normal range.   Given patient presented to ED exam level 2 trauma extensive imaging has been obtained. CTA chest/abdomen/pelvis no evidence of fracture and acute abnormality. CT chest showed incidental finding of dilation of the abdominal aorta 4.3 cm recommended annual imaging followed by CTA or MRA.   CT cervical spine no evidence of fracture. CTA head showed mild right frontal scalp hematoma. Chest x-ray no active disease process. X-ray pelvis no acute abnormality/fracture.   In the ED patient has been given 1 L of LR bolus and nasal Afrin spray.  Also received Tdap vaccine.   Dr. Delana Favors reported that nasal bleeding has been stopped after placing the nasal packing and Afrin nasal spray however patient has minor bleeding.  Family is worried that  given patient is still intoxicated we will not want to take home. Per ED physician patient seems still intoxicated due to alcohol.   Hospitalist has been consulted for further evaluation management of acute metabolic encephalopathy in the setting of alcohol intoxication and concern for alcohol withdrawal.  Assessment & Plan:   Principal Problem:   Alcohol intoxication (HCC) Active Problems:   Acute metabolic encephalopathy   Abdominal aorta dilation   BPH (benign prostatic hyperplasia)   Essential hypertension   Chronic diastolic CHF (congestive heart failure) (HCC)   Scalp hematoma   Epistaxis-resolved   Non-insulin dependent type 2 diabetes mellitus (HCC)   History of DVT (deep vein thrombosis)   Continuous dependence on cigarette smoking  Alcohol intoxication Chronic alcohol use Acute metabolic encephalopathy in the setting of alcohol use Mechanical fall due to alcohol intoxication -Patient presented emergency department secondary to mechanical fall from the stairs under the influence of alcohol. Extensive workup in the ED included imaging no evidence of fracture and dislocation. elevated blood alcohol level around 254.  Encephalopathy has now resolved.  Very minimal withdrawal symptoms.  CIWA under 5.  Continue CIWA protocol with as needed Ativan .  PT OT and TOC consulted.   Scalp hematoma-secondary to fall, hopefully this will resolve by itself.  Epistaxis-resolved -Nasal bleeding has been stopped Afrin nasal spray in the ED.  Patient also received tetanus booster.   History of extensive DVT of the bilateral lower extremity 2020 - Holding Xarelto  in the setting of recent epistaxis and scalp hematoma.  If patient does not redevelop epistaxis and hemoglobin remains stable can resume Xarelto  tomorrow.  Ascending aorta dilation -CT abdomen pelvis showed incidental finding of of dilation of ascending aorta 4.3 cm.  Radiology recommended annual imaging followed by CTA or MRA    Essential hypertension: Controlled.  Continue Coreg .  Chronic diastolic heart failure Appears compensated.  Continue Coreg  twice daily.  Does not appear to be taking any diuretics.   BPH -Continue Flomax   Anemia of chronic disease: Baseline between 12-14.  Currently at baseline.   Continues smoking cigarette -Continue nicotine  patch.  I have discussed tobacco cessation with the patient.  I have counseled the patient regarding the negative impacts of continued tobacco use including but not limited to lung cancer, COPD, and cardiovascular disease.  I have discussed alternatives to tobacco and modalities that may help facilitate tobacco cessation including but not limited to biofeedback, hypnosis, and medications.  Total time spent with tobacco counseling was 5 minutes.   Non-insulin insulin-dependent DM type II - Does not appear to be taking any antidiabetic medications.  Checking hemoglobin A1c.  Continue heart healthy carb modified diet as well as SSI.  At risk of leaving AMA: Patient is very eager to go home.  Had a lengthy discussion with the patient that it is not recommended for him to go home yet since he needs further observation and management of all the issues mentioned above.  He verbalized understanding but continued to remain eager.  He was discouraged to leave AGAINST MEDICAL ADVICE.  DVT prophylaxis: SCDs Start: 03/08/24 2323 Place TED hose Start: 03/08/24 2323   Code Status: Full Code  Family Communication:  None present at bedside.  Plan of care discussed with patient in length and he/she verbalized understanding and agreed with it.  Status is: Observation The patient will require care spanning > 2 midnights and should be moved to inpatient because: Needs further management of acute issues as mentioned above.   Estimated body mass index is 26.5 kg/m as calculated from the following:   Height as of this encounter: 5\' 11"  (1.803 m).   Weight as of this encounter: 86.2  kg.    Nutritional Assessment: Body mass index is 26.5 kg/m.Aaron Aas Seen by dietician.  I agree with the assessment and plan as outlined below: Nutrition Status:        . Skin Assessment: I have examined the patient's skin and I agree with the wound assessment as performed by the wound care RN as outlined below:    Consultants:  None  Procedures:  None  Antimicrobials:  Anti-infectives (From admission, onward)    None         Subjective: Patient seen and examined in the ED.  He has no complaints.  He is eager to go home.  Objective: Vitals:   03/09/24 0400 03/09/24 0500 03/09/24 0600 03/09/24 0700  BP: 118/84 130/88 116/64 (!) 146/82  Pulse: 62 63 65 66  Resp: 19 17 (!) 22 (!) 28  Temp:  98.1 F (36.7 C)    TempSrc:  Oral    SpO2: 96% 96% 99% 100%  Weight:      Height:       No intake or output data in the 24 hours ending 03/09/24 0803 Filed Weights   03/08/24 2132  Weight: 86.2 kg    Examination:  General exam: Appears calm and comfortable  Respiratory system: Clear to auscultation. Respiratory effort normal. Cardiovascular system: S1 & S2 heard, RRR. No JVD, murmurs, rubs, gallops or clicks. No pedal edema. Gastrointestinal system: Abdomen is nondistended, soft and nontender. No organomegaly  or masses felt. Normal bowel sounds heard. Central nervous system: Alert and oriented. No focal neurological deficits. Extremities: Symmetric 5 x 5 power. Skin: No rashes, lesions or ulcers Psychiatry: Judgement and insight appear normal. Mood & affect appropriate.    Data Reviewed: I have personally reviewed following labs and imaging studies  CBC: Recent Labs  Lab 03/08/24 2124 03/08/24 2135 03/09/24 0515  WBC 7.9  --  8.5  HGB 13.1 14.6 12.9*  HCT 40.0 43.0 39.3  MCV 97.6  --  97.3  PLT 274  --  302   Basic Metabolic Panel: Recent Labs  Lab 03/08/24 2124 03/08/24 2135 03/09/24 0515  NA 135 135 137  K 3.4* 3.3* 3.8  CL 100 97* 102  CO2 23  --   27  GLUCOSE 103* 101* 85  BUN 10 10 8   CREATININE 0.71 1.00 0.61  CALCIUM 9.1  --  8.9   GFR: Estimated Creatinine Clearance: 111.1 mL/min (by C-G formula based on SCr of 0.61 mg/dL). Liver Function Tests: Recent Labs  Lab 03/08/24 2124 03/09/24 0515  AST 27 32  ALT 17 19  ALKPHOS 46 44  BILITOT 0.5 0.4  PROT 6.9 6.9  ALBUMIN 3.4* 3.4*   No results for input(s): "LIPASE", "AMYLASE" in the last 168 hours. No results for input(s): "AMMONIA" in the last 168 hours. Coagulation Profile: Recent Labs  Lab 03/08/24 2124  INR 1.2   Cardiac Enzymes: No results for input(s): "CKTOTAL", "CKMB", "CKMBINDEX", "TROPONINI" in the last 168 hours. BNP (last 3 results) No results for input(s): "PROBNP" in the last 8760 hours. HbA1C: No results for input(s): "HGBA1C" in the last 72 hours. CBG: Recent Labs  Lab 03/09/24 0746  GLUCAP 77   Lipid Profile: No results for input(s): "CHOL", "HDL", "LDLCALC", "TRIG", "CHOLHDL", "LDLDIRECT" in the last 72 hours. Thyroid Function Tests: No results for input(s): "TSH", "T4TOTAL", "FREET4", "T3FREE", "THYROIDAB" in the last 72 hours. Anemia Panel: No results for input(s): "VITAMINB12", "FOLATE", "FERRITIN", "TIBC", "IRON", "RETICCTPCT" in the last 72 hours. Sepsis Labs: Recent Labs  Lab 03/08/24 2135  LATICACIDVEN 1.6    No results found for this or any previous visit (from the past 240 hours).   Radiology Studies: CT CHEST ABDOMEN PELVIS W CONTRAST Result Date: 03/08/2024 CLINICAL DATA:  Recent fall with chest and abdominal pain, initial encounter EXAM: CT CHEST, ABDOMEN, AND PELVIS WITH CONTRAST TECHNIQUE: Multidetector CT imaging of the chest, abdomen and pelvis was performed following the standard protocol during bolus administration of intravenous contrast. RADIATION DOSE REDUCTION: This exam was performed according to the departmental dose-optimization program which includes automated exposure control, adjustment of the mA and/or kV  according to patient size and/or use of iterative reconstruction technique. CONTRAST:  75mL OMNIPAQUE  IOHEXOL  350 MG/ML SOLN COMPARISON:  None FINDINGS: CT CHEST FINDINGS Cardiovascular: Mild atherosclerotic calcifications of the ascending aorta are noted. Ascending aorta is dilated to 4.3 cm. No dissection is noted. Normal tapering in aortic arch is seen. Pulmonary artery shows no large central pulmonary embolism. Heart is at the upper limits of normal in size. Scattered coronary calcifications are noted. Mediastinum/Nodes: Thoracic inlet is within normal limits. No hilar or mediastinal adenopathy is noted. The esophagus as visualized is within normal limits. Lungs/Pleura: Lungs are well aerated bilaterally. No focal infiltrate or sizable effusion is seen. Mild emphysematous changes are noted. Musculoskeletal: No acute rib abnormality is noted. Mild degenerative changes of the thoracic spine are seen. CT ABDOMEN PELVIS FINDINGS Hepatobiliary: No focal liver abnormality is seen. No  gallstones, gallbladder wall thickening, or biliary dilatation. Pancreas: Unremarkable. No pancreatic ductal dilatation or surrounding inflammatory changes. Spleen: Normal in size without focal abnormality. Adrenals/Urinary Tract: Adrenal glands are within normal limits. Kidneys demonstrate a normal enhancement pattern bilaterally. Nonobstructing lower pole right renal stone is noted measuring 5 mm. Ureters are within normal limits. No obstructive changes are seen. The bladder is well distended. Stomach/Bowel: No obstructive or inflammatory changes of the colon are noted. The appendix is within normal limits. Small bowel and stomach are unremarkable. Vascular/Lymphatic: Atherosclerotic calcifications of the aorta are noted without aneurysmal dilatation. No dissection is seen. No lymphadenopathy is noted. Reproductive: Prostate is unremarkable. Other: No abdominal wall hernia or abnormality. No abdominopelvic ascites. Musculoskeletal: No  acute or significant osseous findings. IMPRESSION: CT of the chest: Dilatation of the ascending aorta to 4.3 cm. Recommend annual imaging followup by CTA or MRA. This recommendation follows 2010 ACCF/AHA/AATS/ACR/ASA/SCA/SCAI/SIR/STS/SVM Guidelines for the Diagnosis and Management of Patients with Thoracic Aortic Disease. Circulation. 2010; 121: U045-W098. Aortic aneurysm NOS (ICD10-I71.9) No acute abnormality noted. CT of the abdomen and pelvis: 5 mm lower pole right renal stone without obstructive change. No other focal abnormality is noted. Aortic Atherosclerosis (ICD10-I70.0) and Emphysema (ICD10-J43.9). Electronically Signed   By: Violeta Grey M.D.   On: 03/08/2024 22:12   CT HEAD WO CONTRAST Result Date: 03/08/2024 CLINICAL DATA:  Recent fall with headaches and neck pain, initial encounter EXAM: CT HEAD WITHOUT CONTRAST CT CERVICAL SPINE WITHOUT CONTRAST TECHNIQUE: Multidetector CT imaging of the head and cervical spine was performed following the standard protocol without intravenous contrast. Multiplanar CT image reconstructions of the cervical spine were also generated. RADIATION DOSE REDUCTION: This exam was performed according to the departmental dose-optimization program which includes automated exposure control, adjustment of the mA and/or kV according to patient size and/or use of iterative reconstruction technique. COMPARISON:  01/25/2023 FINDINGS: CT HEAD FINDINGS Brain: No evidence of acute infarction, hemorrhage, hydrocephalus, extra-axial collection or mass lesion/mass effect. Vascular: No hyperdense vessel or unexpected calcification. Skull: Normal. Negative for fracture or focal lesion. Sinuses/Orbits: No acute finding. Other: Mild right frontal scalp hematoma is noted. CT CERVICAL SPINE FINDINGS Alignment: Within normal limits. Skull base and vertebrae: 7 cervical segments are well visualized. 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 No acute fracture or acute facet  abnormality is noted. Multilevel osteophytic changes are seen. The odontoid is within normal limits. Soft tissues and spinal canal: Surrounding soft tissue structures are within normal limits. Upper chest: Visualized lung apices are unremarkable. Other: None IMPRESSION: CT of the head: Mild right frontal scalp hematoma. No acute intracranial abnormality noted. CT of the cervical spine: Mild degenerative change without acute abnormality. Electronically Signed   By: Violeta Grey M.D.   On: 03/08/2024 22:04   CT CERVICAL SPINE WO CONTRAST Result Date: 03/08/2024 CLINICAL DATA:  Recent fall with headaches and neck pain, initial encounter EXAM: CT HEAD WITHOUT CONTRAST CT CERVICAL SPINE WITHOUT CONTRAST TECHNIQUE: Multidetector CT imaging of the head and cervical spine was performed following the standard protocol without intravenous contrast. Multiplanar CT image reconstructions of the cervical spine were also generated. RADIATION DOSE REDUCTION: This exam was performed according to the departmental dose-optimization program which includes automated exposure control, adjustment of the mA and/or kV according to patient size and/or use of iterative reconstruction technique. COMPARISON:  01/25/2023 FINDINGS: CT HEAD FINDINGS Brain: No evidence of acute infarction, hemorrhage, hydrocephalus, extra-axial collection or mass lesion/mass effect. Vascular: No hyperdense vessel or unexpected calcification. Skull:  Normal. Negative for fracture or focal lesion. Sinuses/Orbits: No acute finding. Other: Mild right frontal scalp hematoma is noted. CT CERVICAL SPINE FINDINGS Alignment: Within normal limits. Skull base and vertebrae: 7 cervical segments are well visualized. 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 No acute fracture or acute facet abnormality is noted. Multilevel osteophytic changes are seen. The odontoid is within normal limits. Soft tissues and spinal canal: Surrounding soft tissue structures are  within normal limits. Upper chest: Visualized lung apices are unremarkable. Other: None IMPRESSION: CT of the head: Mild right frontal scalp hematoma. No acute intracranial abnormality noted. CT of the cervical spine: Mild degenerative change without acute abnormality. Electronically Signed   By: Violeta Grey M.D.   On: 03/08/2024 22:04   DG Pelvis Portable Result Date: 03/08/2024 CLINICAL DATA:  Recent fall with pelvic pain, initial encounter EXAM: PORTABLE PELVIS 1 VIEWS COMPARISON:  None Available. FINDINGS: There is no evidence of pelvic fracture or diastasis. No pelvic bone lesions are seen. IMPRESSION: No acute abnormality noted. Electronically Signed   By: Violeta Grey M.D.   On: 03/08/2024 21:38   DG Chest Port 1 View Result Date: 03/08/2024 CLINICAL DATA:  Recent fall while on blood thinners, initial encounter EXAM: PORTABLE CHEST 1 VIEW COMPARISON:  01/25/2023 FINDINGS: Cardiac shadow remains enlarged. Aortic calcifications are seen. The lungs are hypo inflated but clear. No bony abnormality is noted. IMPRESSION: No active disease. Electronically Signed   By: Violeta Grey M.D.   On: 03/08/2024 21:38    Scheduled Meds:  carvedilol   6.25 mg Oral BID WC   folic acid   1 mg Oral Daily   insulin aspart  0-5 Units Subcutaneous QHS   insulin aspart  0-6 Units Subcutaneous TID WC   multivitamin with minerals  1 tablet Oral Daily   nicotine   7 mg Transdermal Daily   sodium chloride  flush  3 mL Intravenous Q12H   tamsulosin   0.4 mg Oral QPC supper   thiamine   100 mg Oral Daily   Or   thiamine   100 mg Intravenous Daily   Continuous Infusions:  sodium chloride        LOS: 0 days   Modena Andes, MD [facility]  03/09/2024, 8:03 AM   *Please note that this is a verbal dictation therefore any spelling or grammatical errors are due to the "[employer]" system interpretation.  Please page via Amion and do not message via secure chat for urgent patient care matters. Secure chat can  be used for non urgent patient care matters.  How to contact the [facility] Attending or Consulting provider 7A - 7P or covering provider during after hours 7P -7A, for this patient?  Check the care team in [facility] and look for a) attending/consulting [facility] provider listed and b) the [facility] team listed. Page or secure chat 7A-7P. Log into [email or web address] and use [facility]'s universal password to access. If you do not have the password, please contact the hospital operator. Locate the [facility] provider you are looking for under [facility] and page to a number that you can be directly reached. If you still have difficulty reaching the provider, please page the [facility] (Director on Call) for the Hospitalists listed on amion for assistance.
